# Patient Record
Sex: Female | Born: 1960 | Race: White | Hispanic: No | Smoking: Current some day smoker
Health system: Southern US, Community
[De-identification: ages and names within clinical notes are randomized; demographics above are authoritative.]

## PROBLEM LIST (undated history)

## (undated) DIAGNOSIS — R51 Headache: Secondary | ICD-10-CM

## (undated) DIAGNOSIS — M1990 Unspecified osteoarthritis, unspecified site: Secondary | ICD-10-CM

## (undated) DIAGNOSIS — K219 Gastro-esophageal reflux disease without esophagitis: Secondary | ICD-10-CM

## (undated) DIAGNOSIS — F410 Panic disorder [episodic paroxysmal anxiety] without agoraphobia: Secondary | ICD-10-CM

## (undated) DIAGNOSIS — R519 Headache, unspecified: Secondary | ICD-10-CM

## (undated) DIAGNOSIS — D649 Anemia, unspecified: Secondary | ICD-10-CM

## (undated) DIAGNOSIS — K589 Irritable bowel syndrome without diarrhea: Secondary | ICD-10-CM

## (undated) HISTORY — DX: Panic disorder (episodic paroxysmal anxiety): F41.0

## (undated) HISTORY — DX: Irritable bowel syndrome, unspecified: K58.9

## (undated) HISTORY — DX: Unspecified osteoarthritis, unspecified site: M19.90

## (undated) HISTORY — DX: Anemia, unspecified: D64.9

## (undated) HISTORY — PX: ABDOMINAL HYSTERECTOMY: SHX81

## (undated) HISTORY — DX: Gastro-esophageal reflux disease without esophagitis: K21.9

---

## 2011-07-08 ENCOUNTER — Other Ambulatory Visit: Payer: Self-pay

## 2011-07-08 ENCOUNTER — Emergency Department (HOSPITAL_COMMUNITY)
Admission: EM | Admit: 2011-07-08 | Discharge: 2011-07-08 | Disposition: A | Discharge status: Home / Self Care | Payer: BC Managed Care – PPO | Attending: Emergency Medicine | Admitting: Emergency Medicine

## 2011-07-08 ENCOUNTER — Encounter: Payer: Self-pay | Admitting: *Deleted

## 2011-07-08 DIAGNOSIS — T63441A Toxic effect of venom of bees, accidental (unintentional), initial encounter: Secondary | ICD-10-CM

## 2011-07-08 DIAGNOSIS — T63461A Toxic effect of venom of wasps, accidental (unintentional), initial encounter: Secondary | ICD-10-CM | POA: Insufficient documentation

## 2011-07-08 DIAGNOSIS — F17200 Nicotine dependence, unspecified, uncomplicated: Secondary | ICD-10-CM | POA: Insufficient documentation

## 2011-07-08 DIAGNOSIS — T6391XA Toxic effect of contact with unspecified venomous animal, accidental (unintentional), initial encounter: Secondary | ICD-10-CM | POA: Insufficient documentation

## 2011-07-08 DIAGNOSIS — F411 Generalized anxiety disorder: Secondary | ICD-10-CM | POA: Insufficient documentation

## 2011-07-08 HISTORY — DX: Headache, unspecified: R51.9

## 2011-07-08 HISTORY — DX: Headache: R51

## 2011-07-08 MED ORDER — SODIUM CHLORIDE 0.9 % IV BOLUS (SEPSIS)
1000.0000 mL | Freq: Once | INTRAVENOUS | Status: AC
  Administered 2011-07-08: 1000 mL via INTRAVENOUS

## 2011-07-08 MED ORDER — KETOROLAC TROMETHAMINE 30 MG/ML IJ SOLN
30.0000 mg | Freq: Once | INTRAMUSCULAR | Status: AC
  Administered 2011-07-08: 30 mg via INTRAVENOUS
  Filled 2011-07-08: qty 1

## 2011-07-08 NOTE — ED Provider Notes (Signed)
History   Scribed for Dayton Bailiff, MD, the patient was seen in room APA12/APA12. This chart was scribed by Clarita Crane. This patient's care was started at 7:53PM.  CSN: [id number] Arrival date & time: 07/08/2011  7:12 PM  Chief Complaint  Patient presents with  . Insect Bite   HPI Shaneice Barsanti is a 50 y.o. female who presents to the Emergency Department complaining of moderate painful bee sting proximal to left eye sustained this evening with associated mild left periorbital swelling. Patient notes she immediately took 2x Bendaryl-25mg  with onset of severe generalized HA, near syncope, hotflash and palpitations following. Notes HA has significantly improved since arrival in ED. Denies blurred vision, rash, chest pain, abdominal pain, SOB.  Patient reports she was stung by a bee last week and took Benadryl as well following the sting and did not have an adverse reaction. Also notes she had an episode of similar HA and hot-flash last week while she was mowing the lawn.   HPI ELEMENTS: Location: left periorbital region  Onset: this evening   Severity: moderate  Context:  as above  Associated symptoms: +left periorbital swelling, hot-flash, palpitations, near syncope, HA. Denies blurred vision, rash, chest pain, abdominal pain, SOB.   PAST MEDICAL HISTORY:  Past Medical History  Diagnosis Date  . Head ache     PAST SURGICAL HISTORY:  Past Surgical History  Procedure Date  . Abdominal hysterectomy     MEDICATIONS:  Previous Medications   DIPHENHYDRAMINE (BENADRYL) 25 MG CAPSULE    Take 50 mg by mouth daily as needed. For allergies      ALLERGIES:  Allergies as of 07/08/2011 - Review Complete 07/08/2011  Allergen Reaction Noted  . Bee venom Itching and Swelling 07/08/2011     FAMILY HISTORY:  History reviewed. No pertinent family history.   SOCIAL HISTORY: History   Social History  . Marital Status: Married    Spouse Name: N/A    Number of Children: N/A  . Years of  Education: N/A   Social History Main Topics  . Smoking status: Current Some Day Smoker -- 0.5 packs/day  . Smokeless tobacco: None  . Alcohol Use: No  . Drug Use: No  . Sexually Active: Yes    Birth Control/ Protection: Other-see comments   Other Topics Concern  . None   Social History Narrative  . None    Review of Systems 10 Systems reviewed and are negative for acute change except as noted in the HPI.  Physical Exam  BP 139/86  Pulse 78  Temp(Src) 97.8 F (36.6 C) (Oral)  Resp 18  Ht 5' (1.524 m)  Wt 105 lb (47.628 kg)  BMI 20.51 kg/m2  SpO2 100%  Physical Exam  Nursing note and vitals reviewed. Constitutional: She is oriented to person, place, and time. She appears well-developed and well-nourished. No distress.  HENT:  Head: Normocephalic.  Mouth/Throat: Oropharynx is clear and moist.  Eyes: EOM are normal. Pupils are equal, round, and reactive to light.       Mild left periorbital swelling and redness.   Neck: Neck supple.  Cardiovascular: Normal rate and regular rhythm.  Exam reveals no gallop and no friction rub.   No murmur heard. Pulmonary/Chest: Effort normal and breath sounds normal. She has no wheezes.  Abdominal: Soft. Bowel sounds are normal. She exhibits no distension.  Musculoskeletal: Normal range of motion. She exhibits no edema.  Neurological: She is alert and oriented to person, place, and time. No sensory deficit.  Skin: Skin is warm and dry.  Psychiatric: Her behavior is normal. Her mood appears anxious.    ED Course  Procedures  OTHER DATA REVIEWED: Nursing notes, vital signs, and past medical records reviewed. Lab results reviewed and considered Imaging results reviewed and considered  DIAGNOSTIC STUDIES: Oxygen Saturation is 100% on room air, normal by my interpretation.     Date: 07/08/2011  Rate: 66 BPM  Rhythm: normal sinus rhythm  QRS Axis: normal  Intervals: normal  ST/T Wave abnormalities: normal  Conduction  Disutrbances:none  Narrative Interpretation:   Old EKG Reviewed: none available  ED COURSE / COORDINATION OF CARE: Orders Placed This Encounter  Procedures  . EKG test  9:08PM- Patient informed of results of EKG and intent to d/c home. Home care plan discussed and reasons to return to ED explained. Patient agrees with plan set forth at this time.   MDM: Differential Diagnosis: insect sting with mild reaction.  Anxiety. On arrival to emergency department the patient's symptoms had resolved. She had no rash, shortness of breath, headache. There was question as to whether or not she is syncopal episode. The patient did not actually have a syncopal episode this is more of an anxiety attack she states she's had a history of these with prior insect stings. She received IV fluids, had a negative EKG, received Toradol with resolution of her symptoms. The patient states she's going better and ready for discharge. I advised her to continue by mouth hydration at home. Benadryl as needed. There is no need for prednisone at this time. She did not have an anaphylactic response therefore she does not warrant an EpiPen. She is provided instructions for which returned emergency department. Was instructed to followup with her primary physician as needed   PLAN: Discharge Home The patient is to return the emergency department if there is any worsening of symptoms. I have reviewed the discharge instructions with the patient/family  CONDITION ON DISCHARGE: Good   MEDICATIONS GIVEN IN THE E.D.  Medications  sodium chloride 0.9 % bolus 1,000 mL (1000 mL Intravenous Given 07/08/11 2019)  diphenhydrAMINE (BENADRYL) 25 mg capsule (not administered)  ketorolac (TORADOL) injection 30 mg (30 mg Intravenous Given 07/08/11 2019)      I personally performed the services described in this documentation, which was scribed in my presence. The recorded information has been reviewed and considered.         Dayton Bailiff, MD 07/08/11 2140

## 2011-07-08 NOTE — ED Notes (Signed)
Pt stung over left eye & now has a headache. No respiratory distress noted, no difficulty speaking or swallowing.

## 2011-07-08 NOTE — ED Notes (Signed)
Pt took 2 25mg  bynedral PTA.

## 2011-07-08 NOTE — ED Notes (Signed)
Pt stung over left eye. Pt reports miagrain at this time.

## 2015-04-28 ENCOUNTER — Emergency Department (HOSPITAL_COMMUNITY)
Admission: EM | Admit: 2015-04-28 | Discharge: 2015-04-29 | Disposition: A | Discharge status: Home / Self Care | Payer: PRIVATE HEALTH INSURANCE | Attending: Emergency Medicine | Admitting: Emergency Medicine

## 2015-04-28 ENCOUNTER — Emergency Department (HOSPITAL_COMMUNITY): Payer: PRIVATE HEALTH INSURANCE

## 2015-04-28 ENCOUNTER — Encounter (HOSPITAL_COMMUNITY): Payer: Self-pay | Admitting: *Deleted

## 2015-04-28 DIAGNOSIS — S0990XA Unspecified injury of head, initial encounter: Secondary | ICD-10-CM | POA: Diagnosis present

## 2015-04-28 DIAGNOSIS — W01198A Fall on same level from slipping, tripping and stumbling with subsequent striking against other object, initial encounter: Secondary | ICD-10-CM | POA: Insufficient documentation

## 2015-04-28 DIAGNOSIS — S060X0A Concussion without loss of consciousness, initial encounter: Secondary | ICD-10-CM | POA: Diagnosis not present

## 2015-04-28 DIAGNOSIS — Y9389 Activity, other specified: Secondary | ICD-10-CM | POA: Diagnosis not present

## 2015-04-28 DIAGNOSIS — Y92096 Garden or yard of other non-institutional residence as the place of occurrence of the external cause: Secondary | ICD-10-CM | POA: Diagnosis not present

## 2015-04-28 DIAGNOSIS — S0181XA Laceration without foreign body of other part of head, initial encounter: Secondary | ICD-10-CM | POA: Insufficient documentation

## 2015-04-28 DIAGNOSIS — Z23 Encounter for immunization: Secondary | ICD-10-CM | POA: Diagnosis not present

## 2015-04-28 DIAGNOSIS — S0993XA Unspecified injury of face, initial encounter: Secondary | ICD-10-CM | POA: Diagnosis not present

## 2015-04-28 DIAGNOSIS — Z720 Tobacco use: Secondary | ICD-10-CM | POA: Diagnosis not present

## 2015-04-28 DIAGNOSIS — Y998 Other external cause status: Secondary | ICD-10-CM | POA: Insufficient documentation

## 2015-04-28 MED ORDER — LIDOCAINE-EPINEPHRINE-TETRACAINE (LET) SOLUTION
NASAL | Status: AC
  Administered 2015-04-29: 3 mL
  Filled 2015-04-28: qty 6

## 2015-04-28 MED ORDER — LIDOCAINE HCL (PF) 1 % IJ SOLN: 5.0000 mL | Freq: Once | INTRAMUSCULAR | Status: DC

## 2015-04-28 MED ORDER — LIDOCAINE HCL (PF) 1 % IJ SOLN
INTRAMUSCULAR | Status: AC
  Filled 2015-04-28: qty 10

## 2015-04-28 MED ORDER — TETANUS-DIPHTH-ACELL PERTUSSIS 5-2.5-18.5 LF-MCG/0.5 IM SUSP
0.5000 mL | Freq: Once | INTRAMUSCULAR | Status: AC
  Administered 2015-04-28: 0.5 mL via INTRAMUSCULAR
  Filled 2015-04-28: qty 0.5

## 2015-04-28 MED ORDER — LIDOCAINE-EPINEPHRINE-TETRACAINE (LET) SOLUTION
3.0000 mL | Freq: Once | NASAL | Status: AC
  Administered 2015-04-28: 3 mL via TOPICAL

## 2015-04-28 NOTE — ED Provider Notes (Signed)
CSN: [id number]     Arrival date & time 04/28/15  2037 History  This chart was scribed for Christy Rhineonald Karalee Hauter, MD by Merlene LaughterAdem Cosgel, ED Scribe. This patient was seen in room APA10/APA10 and the patient's care was started at 9:54 PM.  Chief Complaint  Patient presents with  . Facial Laceration   Patient is a 54 y.o. female presenting with scalp laceration. The history is provided by the patient. No language interpreter was used.  Head Laceration This is a new problem. The problem has not changed since onset.Associated symptoms include headaches. Pertinent negatives include no chest pain. Nothing aggravates the symptoms. Nothing relieves the symptoms. She has tried nothing for the symptoms.    HPI Comments: Christy Irwin is a 54 y.o. female who presents to the Emergency Department complaining of 4-5 cm, facial laceration to her forehead onset earlier today.  She states she was in the yard, where she fell and hit her head on a metal yard decoration. Patient complains of associated controlled bleeding, headache, loose incisor. Patient denies LOC, dizziness, chest pain and weakness. Her last tetanus shot was 25 years ago.  She also feels her tooth may be loose Past Medical History  Diagnosis Date  . Head ache    Past Surgical History  Procedure Laterality Date  . Abdominal hysterectomy     History reviewed. No pertinent family history. History  Substance Use Topics  . Smoking status: Current Some Day Smoker -- 0.50 packs/day  . Smokeless tobacco: Not on file  . Alcohol Use: Yes   OB History    No data available     Review of Systems  HENT: Positive for dental problem.   Cardiovascular: Negative for chest pain.  Skin: Positive for wound.  Neurological: Positive for headaches. Negative for dizziness, syncope and weakness.  All other systems reviewed and are negative.     Allergies  Bee venom  Home Medications   Prior to Admission medications   Medication Sig Start Date End Date  Taking? Authorizing Provider  diphenhydrAMINE (BENADRYL) 25 mg capsule Take 50 mg by mouth daily as needed. For allergies     Historical Provider, MD   Triage Vitals: BP 127/79 mmHg  Pulse 105  Temp(Src) 98.5 F (36.9 C) (Oral)  Resp 18  Ht 5' (1.524 m)  Wt 130 lb (58.968 kg)  BMI 25.39 kg/m2  SpO2 98%   Physical Exam  CONSTITUTIONAL: Well developed/well nourished, smells of alcohol HEAD: Abrasion to posterior scalp, large v-shaped laceration to forehead EYES: EOMI/PERRL ENMT: Mucous membranes moist,right upper incisor loose to palpation but is stable and not fractured, no other dental/nasal trauma NECK: supple no meningeal signs SPINE/BACK:entire spine nontender CV: S1/S2 noted, no murmurs/rubs/gallops noted LUNGS: Lungs are clear to auscultation bilaterally, no apparent distress ABDOMEN: soft, nontender, no rebound or guarding, bowel sounds noted throughout abdomen GU:no cva tenderness NEURO: Pt is awake/alert/appropriate, moves all extremitiesx4.  No facial droop.  Pt is ambulatory EXTREMITIES: pulses normal/equal, full ROM, no evidence of extremity trauma SKIN: warm, color normal PSYCH: no abnormalities of mood noted, alert and oriented to situation   ED Course  Procedures DIAGNOSTIC STUDIES: Oxygen Saturation is 98% on room air, normal by my interpretation.    COORDINATION OF CARE: 9:59 PM- Discussed plan for diagnostic CT of head. Pt advised of plan for treatment, which includes laceration repair, tetanus shot and medication for pain and pt agrees. Pt smells of ETOH with large laceration to forehead as well as abrasion to scalp - CT  head indicated CT head negative Also has mild dental subluxation but no fx noted.  Advised soft diet and f/u with dentistry Forehead laceration to be repaired by Nurse practitioner. She told nurse she "passed out" twice but denies this on my evaluation  Imaging Review Ct Head Wo Contrast  04/28/2015   CLINICAL DATA:  Christy Irwin, striking a  metal yard duration at home today. Syncopal episodes twice since then.  EXAM: CT HEAD WITHOUT CONTRAST  TECHNIQUE: Contiguous axial images were obtained from the base of the skull through the vertex without intravenous contrast.  COMPARISON:  None.  FINDINGS: There is mild soft tissue thickening in the high right parietal scalp. The calvarium and skullbase are intact.  There is no intracranial hemorrhage, mass or evidence of acute infarction. There is no extra-axial fluid collection. Gray matter and white matter appear normal. Cerebral volume is normal for age. Brainstem and posterior fossa are unremarkable. The CSF spaces appear normal.  IMPRESSION: Negative for acute intracranial traumatic injury.  Normal brain.   Electronically Signed   By: Ellery Plunk M.D.   On: 04/28/2015 22:17     MDM   Final diagnoses:  Concussion, without loss of consciousness, initial encounter  Laceration of forehead, initial encounter  Dental injury, initial encounter    Nursing notes including past medical history and social history reviewed and considered in documentation   I personally performed the services described in this documentation, which was scribed in my presence. The recorded information has been reviewed and is accurate.       Christy Rhine, MD 04/28/15 [phone]

## 2015-04-28 NOTE — ED Provider Notes (Signed)
**  THIS IS A SHARED VISIT WITH DR. Bebe ShaggyWICKLINE** LACERATION REPAIR  Harlen LabsConnie Irwin is a 54 y.o. female with a 5 cm laceration the forehead after she fell in her garden tonight. LACERATION REPAIR Performed by: Demarkis Gheen Authorized by: Johnna Bollier Consent: Verbal consent obtained. Risks and benefits: risks, benefits and alternatives were discussed Consent given by: patient Patient identity confirmed: provided demographic data Prepped and Draped in normal sterile fashion Wound explored  Laceration Location: forehead V shaped  Laceration Length: 5 cm  No Foreign Bodies seen or palpated  Anesthesia: local infiltration  Local anesthetic: lidocaine 1% without epinephrine  Anesthetic total: 6 ml  Irrigation method: syringe Amount of cleaning: standard  Skin closure: 6-0 prolene  Number of sutures: 17  Technique: interrupted  Patient tolerance: Patient tolerated the procedure well with no immediate complications.   Instructions to patient regarding follow up.   [address]Christy Irwin [place] Christy [place], [place] 04/28/15 [phone]  Zadie Rhineonald Wickline, MD 04/29/15 [phone]

## 2015-04-28 NOTE — ED Notes (Signed)
Pt fell and hit a metal yard decoration in yard today, pt states that she passed out x 2 since

## 2015-04-28 NOTE — Discharge Instructions (Signed)
Concussion  A concussion, or closed-head injury, is a brain injury caused by a direct blow to the head or by a quick and sudden movement (jolt) of the head or neck. Concussions are usually not life-threatening. Even so, the effects of a concussion can be serious. If you have had a concussion before, you are more likely to experience concussion-like symptoms after a direct blow to the head.   CAUSES  · Direct blow to the head, such as from running into another player during a soccer game, being hit in a fight, or hitting your head on a hard surface.  · A jolt of the head or neck that causes the brain to move back and forth inside the skull, such as in a car crash.  SIGNS AND SYMPTOMS  The signs of a concussion can be hard to notice. Early on, they may be missed by you, family members, and health care providers. You may look fine but act or feel differently.  Symptoms are usually temporary, but they may last for days, weeks, or even longer. Some symptoms may appear right away while others may not show up for hours or days. Every head injury is different. Symptoms include:  · Mild to moderate headaches that will not go away.  · A feeling of pressure inside your head.  · Having more trouble than usual:  ¨ Learning or remembering things you have heard.  ¨ Answering questions.  ¨ Paying attention or concentrating.  ¨ Organizing daily tasks.  ¨ Making decisions and solving problems.  · Slowness in thinking, acting or reacting, speaking, or reading.  · Getting lost or being easily confused.  · Feeling tired all the time or lacking energy (fatigued).  · Feeling drowsy.  · Sleep disturbances.  ¨ Sleeping more than usual.  ¨ Sleeping less than usual.  ¨ Trouble falling asleep.  ¨ Trouble sleeping (insomnia).  · Loss of balance or feeling lightheaded or dizzy.  · Nausea or vomiting.  · Numbness or tingling.  · Increased sensitivity to:  ¨ Sounds.  ¨ Lights.  ¨ Distractions.  · Vision problems or eyes that tire  easily.  · Diminished sense of taste or smell.  · Ringing in the ears.  · Mood changes such as feeling sad or anxious.  · Becoming easily irritated or angry for little or no reason.  · Lack of motivation.  · Seeing or hearing things other people do not see or hear (hallucinations).  DIAGNOSIS  Your health care provider can usually diagnose a concussion based on a description of your injury and symptoms. He or she will ask whether you passed out (lost consciousness) and whether you are having trouble remembering events that happened right before and during your injury.  Your evaluation might include:  · A brain scan to look for signs of injury to the brain. Even if the test shows no injury, you may still have a concussion.  · Blood tests to be sure other problems are not present.  TREATMENT  · Concussions are usually treated in an emergency department, in urgent care, or at a clinic. You may need to stay in the hospital overnight for further treatment.  · Tell your health care provider if you are taking any medicines, including prescription medicines, over-the-counter medicines, and natural remedies. Some medicines, such as blood thinners (anticoagulants) and aspirin, may increase the chance of complications. Also tell your health care provider whether you have had alcohol or are taking illegal drugs. This information   may affect treatment.  · Your health care provider will send you home with important instructions to follow.  · How fast you will recover from a concussion depends on many factors. These factors include how severe your concussion is, what part of your brain was injured, your age, and how healthy you were before the concussion.  · Most people with mild injuries recover fully. Recovery can take time. In general, recovery is slower in older persons. Also, persons who have had a concussion in the past or have other medical problems may find that it takes longer to recover from their current injury.  HOME  CARE INSTRUCTIONS  General Instructions  · Carefully follow the directions your health care provider gave you.  · Only take over-the-counter or prescription medicines for pain, discomfort, or fever as directed by your health care provider.  · Take only those medicines that your health care provider has approved.  · Do not drink alcohol until your health care provider says you are well enough to do so. Alcohol and certain other drugs may slow your recovery and can put you at risk of further injury.  · If it is harder than usual to remember things, write them down.  · If you are easily distracted, try to do one thing at a time. For example, do not try to watch TV while fixing dinner.  · Talk with family members or close friends when making important decisions.  · Keep all follow-up appointments. Repeated evaluation of your symptoms is recommended for your recovery.  · Watch your symptoms and tell others to do the same. Complications sometimes occur after a concussion. Older adults with a brain injury may have a higher risk of serious complications, such as a blood clot on the brain.  · Tell your teachers, school nurse, school counselor, coach, athletic trainer, or work manager about your injury, symptoms, and restrictions. Tell them about what you can or cannot do. They should watch for:  ¨ Increased problems with attention or concentration.  ¨ Increased difficulty remembering or learning new information.  ¨ Increased time needed to complete tasks or assignments.  ¨ Increased irritability or decreased ability to cope with stress.  ¨ Increased symptoms.  · Rest. Rest helps the brain to heal. Make sure you:  ¨ Get plenty of sleep at night. Avoid staying up late at night.  ¨ Keep the same bedtime hours on weekends and weekdays.  ¨ Rest during the day. Take daytime naps or rest breaks when you feel tired.  · Limit activities that require a lot of thought or concentration. These include:  ¨ Doing homework or job-related  work.  ¨ Watching TV.  ¨ Working on the computer.  · Avoid any situation where there is potential for another head injury (football, hockey, soccer, basketball, martial arts, downhill snow sports and horseback riding). Your condition will get worse every time you experience a concussion. You should avoid these activities until you are evaluated by the appropriate follow-up health care providers.  Returning To Your Regular Activities  You will need to return to your normal activities slowly, not all at once. You must give your body and brain enough time for recovery.  · Do not return to sports or other athletic activities until your health care provider tells you it is safe to do so.  · Ask your health care provider when you can drive, ride a bicycle, or operate heavy machinery. Your ability to react may be slower after a   brain injury. Never do these activities if you are dizzy.  · Ask your health care provider about when you can return to work or school.  Preventing Another Concussion  It is very important to avoid another brain injury, especially before you have recovered. In rare cases, another injury can lead to permanent brain damage, brain swelling, or death. The risk of this is greatest during the first 7-10 days after a head injury. Avoid injuries by:  · Wearing a seat belt when riding in a car.  · Drinking alcohol only in moderation.  · Wearing a helmet when biking, skiing, skateboarding, skating, or doing similar activities.  · Avoiding activities that could lead to a second concussion, such as contact or recreational sports, until your health care provider says it is okay.  · Taking safety measures in your home.  ¨ Remove clutter and tripping hazards from floors and stairways.  ¨ Use grab bars in bathrooms and handrails by stairs.  ¨ Place non-slip mats on floors and in bathtubs.  ¨ Improve lighting in dim areas.  SEEK MEDICAL CARE IF:  · You have increased problems paying attention or  concentrating.  · You have increased difficulty remembering or learning new information.  · You need more time to complete tasks or assignments than before.  · You have increased irritability or decreased ability to cope with stress.  · You have more symptoms than before.  Seek medical care if you have any of the following symptoms for more than 2 weeks after your injury:  · Lasting (chronic) headaches.  · Dizziness or balance problems.  · Nausea.  · Vision problems.  · Increased sensitivity to noise or light.  · Depression or mood swings.  · Anxiety or irritability.  · Memory problems.  · Difficulty concentrating or paying attention.  · Sleep problems.  · Feeling tired all the time.  SEEK IMMEDIATE MEDICAL CARE IF:  · You have severe or worsening headaches. These may be a sign of a blood clot in the brain.  · You have weakness (even if only in one hand, leg, or part of the face).  · You have numbness.  · You have decreased coordination.  · You vomit repeatedly.  · You have increased sleepiness.  · One pupil is larger than the other.  · You have convulsions.  · You have slurred speech.  · You have increased confusion. This may be a sign of a blood clot in the brain.  · You have increased restlessness, agitation, or irritability.  · You are unable to recognize people or places.  · You have neck pain.  · It is difficult to wake you up.  · You have unusual behavior changes.  · You lose consciousness.  MAKE SURE YOU:  · Understand these instructions.  · Will watch your condition.  · Will get help right away if you are not doing well or get worse.  Document Released: 01/07/2004 Document Revised: 10/22/2013 Document Reviewed: 05/09/2013  [employer]® Patient Information ©2015 [employer]. This information is not intended to replace advice given to you by your health care provider. Make sure you discuss any questions you have with your health care provider.

## 2015-05-05 ENCOUNTER — Emergency Department (HOSPITAL_COMMUNITY)
Admission: EM | Admit: 2015-05-05 | Discharge: 2015-05-05 | Disposition: A | Discharge status: Home / Self Care | Payer: PRIVATE HEALTH INSURANCE | Attending: Emergency Medicine | Admitting: Emergency Medicine

## 2015-05-05 ENCOUNTER — Encounter (HOSPITAL_COMMUNITY): Payer: Self-pay | Admitting: Emergency Medicine

## 2015-05-05 DIAGNOSIS — Z720 Tobacco use: Secondary | ICD-10-CM | POA: Diagnosis not present

## 2015-05-05 DIAGNOSIS — Z4802 Encounter for removal of sutures: Secondary | ICD-10-CM | POA: Insufficient documentation

## 2015-05-05 NOTE — ED Notes (Signed)
PT had stitches on 04/28/15 with no c/o pain or drainage noted.

## 2015-05-05 NOTE — Discharge Instructions (Signed)
Scar Minimization °You will have a scar anytime you have surgery and a cut is made in the skin or you have something removed from your skin (mole, skin cancer, cyst). Although scars are unavoidable following surgery, there are ways to minimize their appearance. °It is important to follow all the instructions you receive from your caregiver about wound care. How your wound heals will influence the appearance of your scar. If you do not follow the wound care instructions as directed, complications such as infection may occur. Wound instructions include keeping the wound clean, moist, and not letting the wound form a scab. Some people form scars that are raised and lumpy (hypertrophic) or larger than the initial wound (keloidal). °HOME CARE INSTRUCTIONS  °· Follow wound care instructions as directed. °· Keep the wound clean by washing it with soap and water. °· Keep the wound moist with provided antibiotic cream or petroleum jelly until completely healed. Moisten twice a day for about 2 weeks. °· Get stitches (sutures) taken out at the scheduled time. °· Avoid touching or manipulating your wound unless needed. Wash your hands thoroughly before and after touching your wound. °· Follow all restrictions such as limits on exercise or work. This depends on where your scar is located. °· Keep the scar protected from sunburn. Cover the scar with sunscreen/sunblock with SPF 30 or higher. °· Gently massage the scar using a circular motion to help minimize the appearance of the scar. Do this only after the wound has closed and all the sutures have been removed. °· For hypertrophic or keloidal scars, there are several ways to treat and minimize their appearance. Methods include compression therapy, intralesional corticosteroids, laser therapy, or surgery. These methods are performed by your caregiver. °Remember that the scar may appear lighter or darker than your normal skin color. This difference in color should even out with  time. °SEEK MEDICAL CARE IF:  °· You have a fever. °· You develop signs of infection such as pain, redness, pus, and warmth. °· You have questions or concerns. °Document Released: 04/06/2010 Document Revised: 01/09/2012 Document Reviewed: 04/06/2010 °ExitCare® Patient Information ©2015 [employer]. This information is not intended to replace advice given to you by your health care provider. Make sure you discuss any questions you have with your health care provider. ° °Suture Removal, Care After °Refer to this sheet in the next few weeks. These instructions provide you with information on caring for yourself after your procedure. Your health care provider may also give you more specific instructions. Your treatment has been planned according to current medical practices, but problems sometimes occur. Call your health care provider if you have any problems or questions after your procedure. °WHAT TO EXPECT AFTER THE PROCEDURE °After your stitches (sutures) are removed, it is typical to have the following: °· Some discomfort and swelling in the wound area. °· Slight redness in the area. °HOME CARE INSTRUCTIONS  °· If you have skin adhesive strips over the wound area, do not take the strips off. They will fall off on their own in a few days. If the strips remain in place after 14 days, you may remove them. °· Change any bandages (dressings) at least once a day or as directed by your health care provider. If the bandage sticks, soak it off with warm, soapy water. °· Apply cream or ointment only as directed by your health care provider. If using cream or ointment, wash the area with soap and water 2 times a day to remove   all the cream or ointment. Rinse off the soap and pat the area dry with a clean towel. °· Keep the wound area dry and clean. If the bandage becomes wet or dirty, or if it develops a bad smell, change it as soon as possible. °· Continue to protect the wound from injury. °· Use sunscreen when out in the  sun. New scars become sunburned easily. °SEEK MEDICAL CARE IF: °· You have increasing redness, swelling, or pain in the wound. °· You see pus coming from the wound. °· You have a fever. °· You notice a bad smell coming from the wound or dressing. °· Your wound breaks open (edges not staying together). °Document Released: 07/12/2001 Document Revised: 08/07/2013 Document Reviewed: 05/29/2013 °ExitCare® Patient Information ©2015 [employer]. This information is not intended to replace advice given to you by your health care provider. Make sure you discuss any questions you have with your health care provider. ° °

## 2015-05-05 NOTE — ED Provider Notes (Signed)
CSN: [id number]     Arrival date & time 05/05/15  1342 History   First MD Initiated Contact with Patient 05/05/15 1412     Chief Complaint  Patient presents with  . Suture / Staple Removal     (Consider location/radiation/quality/duration/timing/severity/associated sxs/prior Treatment) HPI  Christy Irwin is a 54 y.o. female who presents to the Emergency Department requesting suture removal.  She was seen here on 04/28/15 after a fall.  She denies any pain or worsening symptoms.  States the wound has been healing well.  Denies swelling, drainage, redness or dizziness, and visual changes  Past Medical History  Diagnosis Date  . Head ache    Past Surgical History  Procedure Laterality Date  . Abdominal hysterectomy     History reviewed. No pertinent family history. History  Substance Use Topics  . Smoking status: Current Some Day Smoker -- 0.50 packs/day  . Smokeless tobacco: Not on file  . Alcohol Use: Yes   OB History    Gravida Para Term Preterm AB TAB SAB Ectopic Multiple Living            2     Review of Systems  Constitutional: Negative for fever and chills.  Musculoskeletal: Negative for back pain, joint swelling and arthralgias.  Skin: Positive for wound.       Laceration forehead with intact sutures  Neurological: Negative for dizziness, weakness, numbness and headaches.  Hematological: Does not bruise/bleed easily.  All other systems reviewed and are negative.     Allergies  Bee venom  Home Medications   Prior to Admission medications   Medication Sig Start Date End Date Taking? Authorizing Provider  diphenhydrAMINE (BENADRYL) 25 mg capsule Take 50 mg by mouth daily as needed. For allergies     Historical Provider, MD   BP 116/72 mmHg  Pulse 84  Temp(Src) 99 F (37.2 C) (Oral)  Resp 18  Ht 5' (1.524 m)  Wt 109 lb (49.442 kg)  BMI 21.29 kg/m2  SpO2 97% Physical Exam  Constitutional: She is oriented to person, place, and time. She appears  well-developed and well-nourished. No distress.  HENT:  Head: Normocephalic and atraumatic.  Cardiovascular: Normal rate, regular rhythm, normal heart sounds and intact distal pulses.   No murmur heard. Pulmonary/Chest: Effort normal and breath sounds normal. No respiratory distress.  Musculoskeletal: She exhibits no edema or tenderness.  Neurological: She is alert and oriented to person, place, and time. She exhibits normal muscle tone. Coordination normal.  Skin: Skin is warm. Laceration noted.  Laceration to the forehead.  Wound appears to be healing well.  Sutures intact. No edema or drainage. Wound dehiscence  Nursing note and vitals reviewed.   ED Course  Procedures (including critical care time) Labs Review Labs Reviewed - No data to display  Imaging Review No results found.   EKG Interpretation None      MDM   Final diagnoses:  Visit for suture removal    Laceration to forehead with sutures placed on 04/28/2015.  appears to be healing well. Suture line intact patient denies complaints at this time.  Sutures removed by nursing staff without difficulty. Wound care instructions given. Patient ambulatory with a steady gait. Agrees to follow-up with PCP if needed.  Pauline Ausammy Kashton Mcartor, PA-C 05/06/15 1707  Gilda Creasehristopher J Pollina, MD 05/08/15 [phone]

## 2016-04-20 ENCOUNTER — Other Ambulatory Visit (HOSPITAL_COMMUNITY): Payer: Self-pay | Admitting: Registered Nurse

## 2016-04-20 ENCOUNTER — Ambulatory Visit (HOSPITAL_COMMUNITY)
Admission: RE | Admit: 2016-04-20 | Discharge: 2016-04-20 | Disposition: A | Discharge status: Home / Self Care | Payer: PRIVATE HEALTH INSURANCE | Source: Ambulatory Visit | Attending: Registered Nurse | Admitting: Registered Nurse

## 2016-04-20 DIAGNOSIS — R0781 Pleurodynia: Secondary | ICD-10-CM | POA: Diagnosis not present

## 2016-04-20 DIAGNOSIS — M4806 Spinal stenosis, lumbar region: Secondary | ICD-10-CM | POA: Insufficient documentation

## 2016-04-20 DIAGNOSIS — M5136 Other intervertebral disc degeneration, lumbar region: Secondary | ICD-10-CM | POA: Insufficient documentation

## 2016-04-20 DIAGNOSIS — M545 Low back pain: Secondary | ICD-10-CM | POA: Diagnosis not present

## 2016-04-25 ENCOUNTER — Other Ambulatory Visit (HOSPITAL_COMMUNITY): Payer: Self-pay | Admitting: Registered Nurse

## 2016-04-25 ENCOUNTER — Ambulatory Visit (HOSPITAL_COMMUNITY)
Admission: RE | Admit: 2016-04-25 | Discharge: 2016-04-25 | Disposition: A | Discharge status: Home / Self Care | Payer: PRIVATE HEALTH INSURANCE | Source: Ambulatory Visit | Attending: Registered Nurse | Admitting: Registered Nurse

## 2016-04-25 DIAGNOSIS — M25551 Pain in right hip: Secondary | ICD-10-CM

## 2016-04-26 ENCOUNTER — Other Ambulatory Visit (HOSPITAL_COMMUNITY): Payer: Self-pay | Admitting: Registered Nurse

## 2016-04-26 DIAGNOSIS — Z1231 Encounter for screening mammogram for malignant neoplasm of breast: Secondary | ICD-10-CM

## 2016-05-02 ENCOUNTER — Ambulatory Visit (HOSPITAL_COMMUNITY)
Admission: RE | Admit: 2016-05-02 | Discharge: 2016-05-02 | Disposition: A | Discharge status: Home / Self Care | Payer: PRIVATE HEALTH INSURANCE | Source: Ambulatory Visit | Attending: Registered Nurse | Admitting: Registered Nurse

## 2016-05-02 DIAGNOSIS — Z1231 Encounter for screening mammogram for malignant neoplasm of breast: Secondary | ICD-10-CM | POA: Insufficient documentation

## 2016-09-09 IMAGING — CT CT HEAD W/O CM
1 series · 16 of 30 positions shown, 20 images · non-contrast
Comparison: None.

CLINICAL DATA: Fell, striking a metal yard duration at home today.
Syncopal episodes twice since then.

EXAM:
CT HEAD WITHOUT CONTRAST
TECHNIQUE: Contiguous axial images were obtained from the base of the skull
through the vertex without intravenous contrast.

[Series 2: headtrauma 4.8 h37s · axial · 0.43mm/px · z∈[+76,+234]mm · 16 of 36 slices shown, 20 images]
[im 2/36  brain]
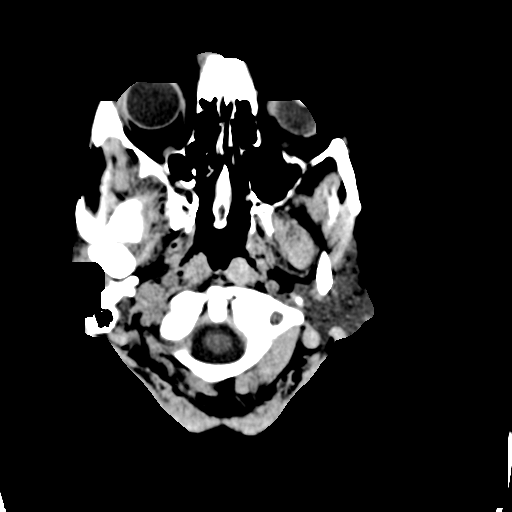
[im 2/36  bone]
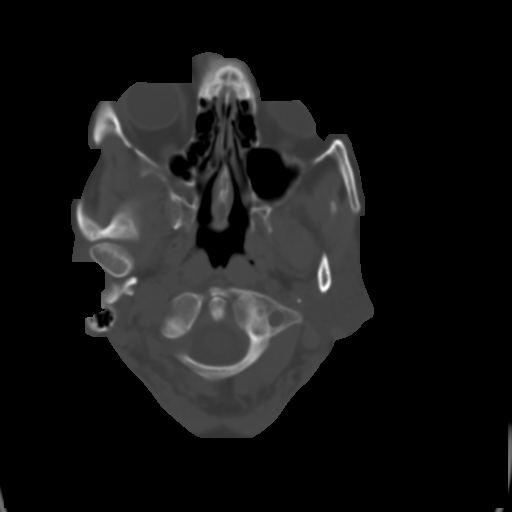
[im 4/36  brain]
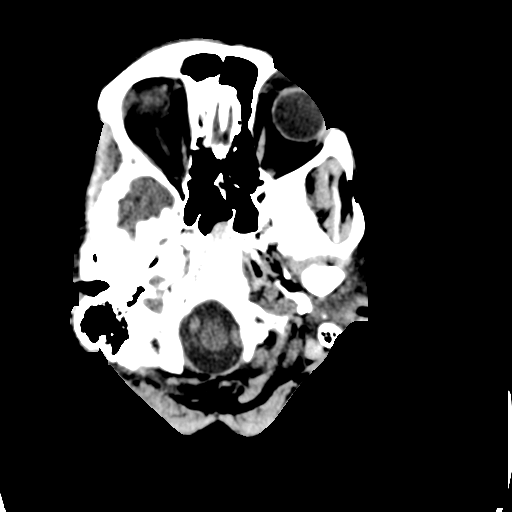
[im 7/36  brain]
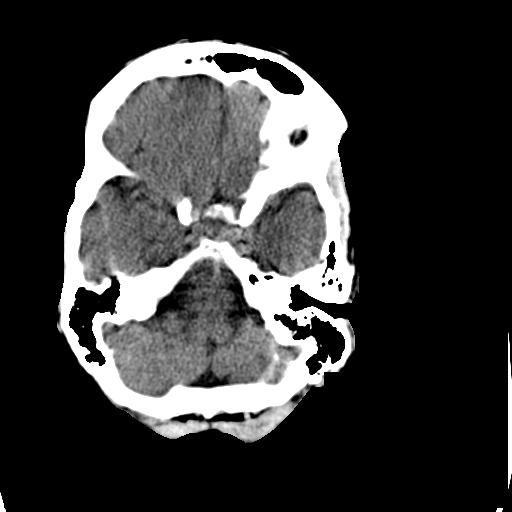
[im 9/36  brain]
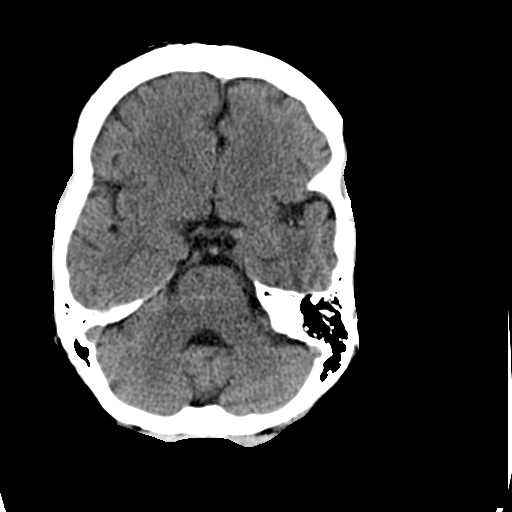
[im 10/36  brain]
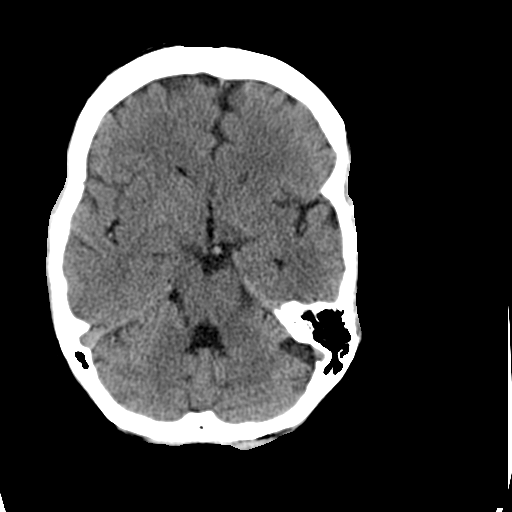
[im 10/36  bone]
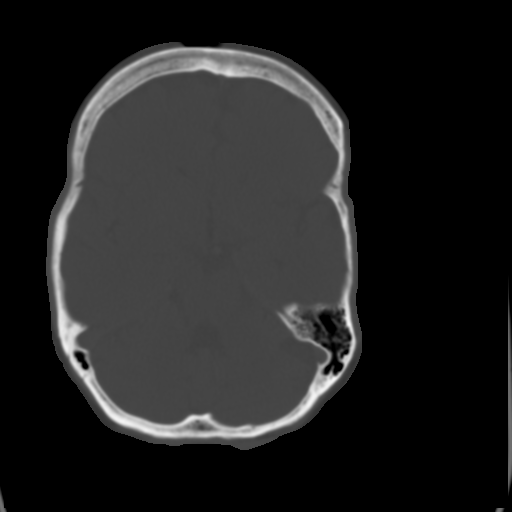
[im 13/36  brain]
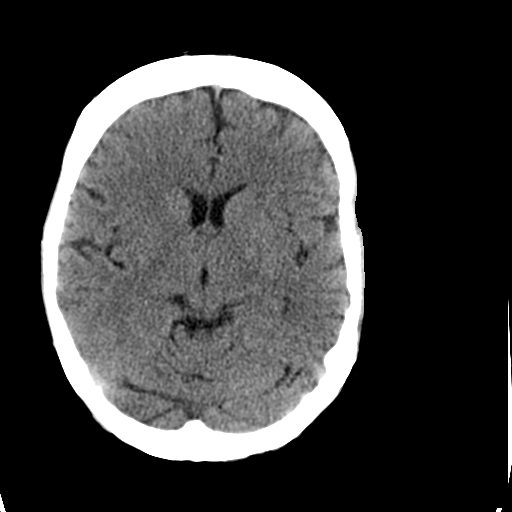
[im 15/36  brain]
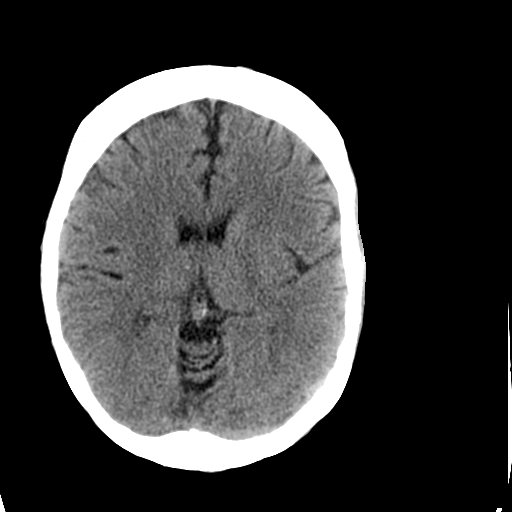
[im 17/36  brain]
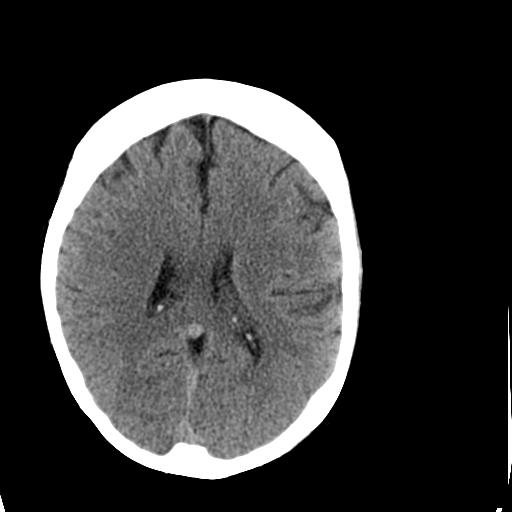
[im 19/36  brain]
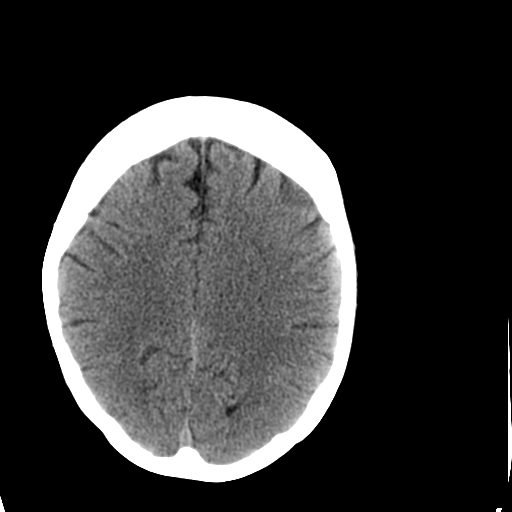
[im 19/36  bone]
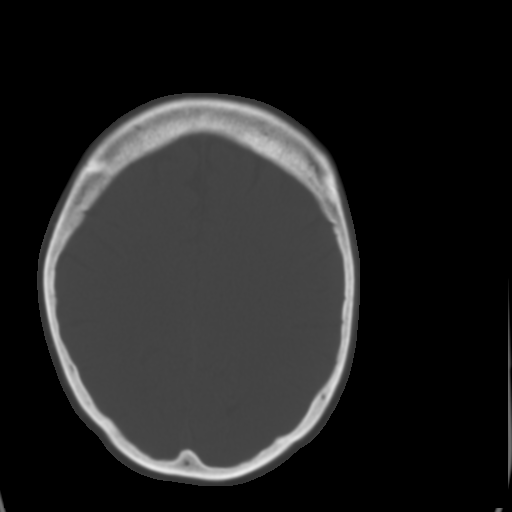
[im 21/36  brain]
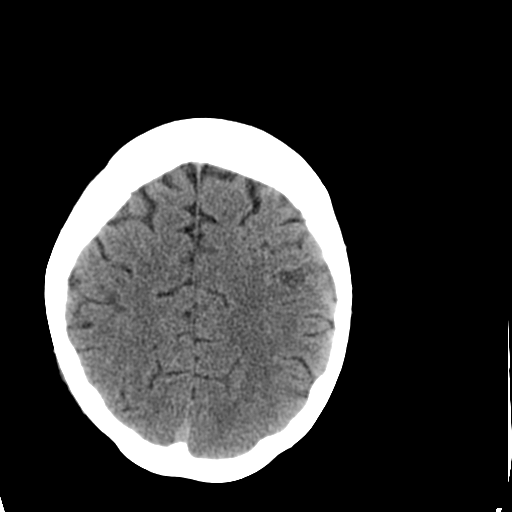
[im 23/36  brain]
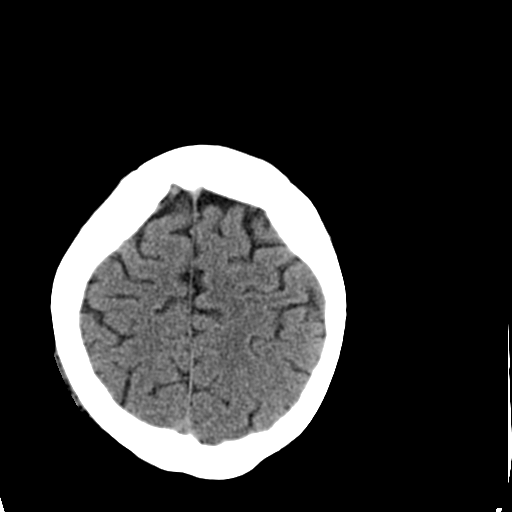
[im 26/36  brain]
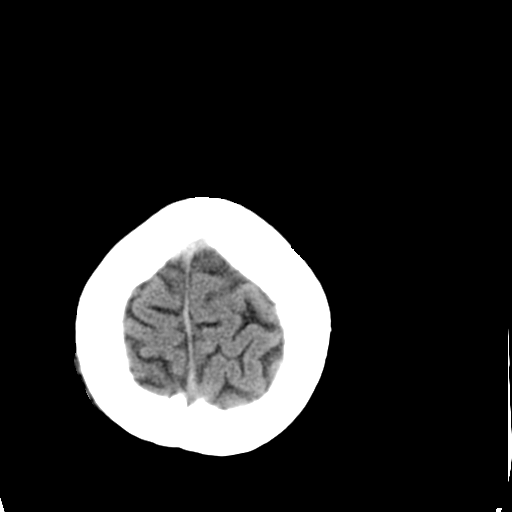
[im 27/36  brain]
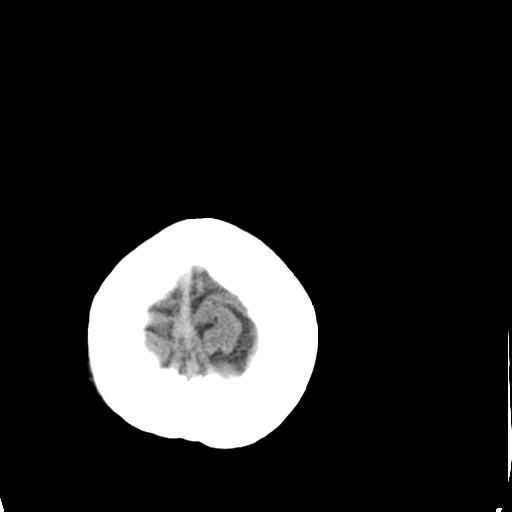
[im 27/36  bone]
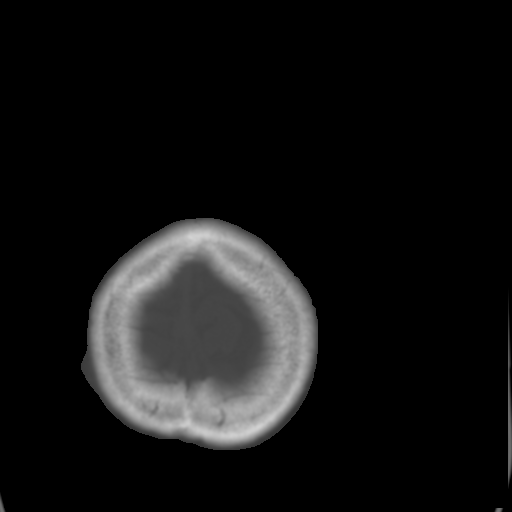
[im 29/36  brain]
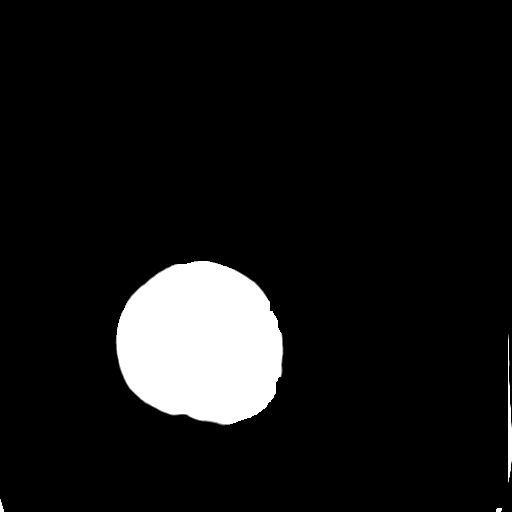
[im 32/36  brain]
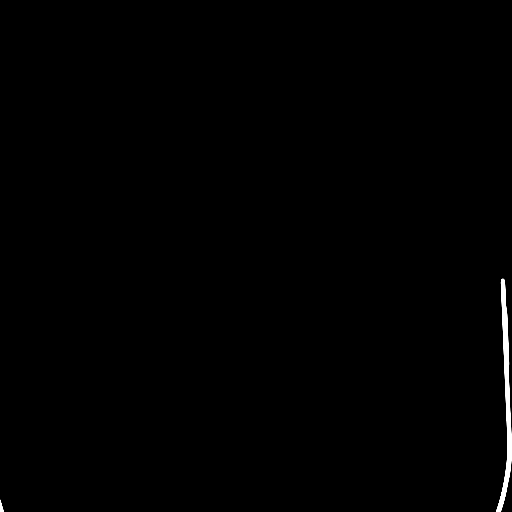
[im 34/36  brain]
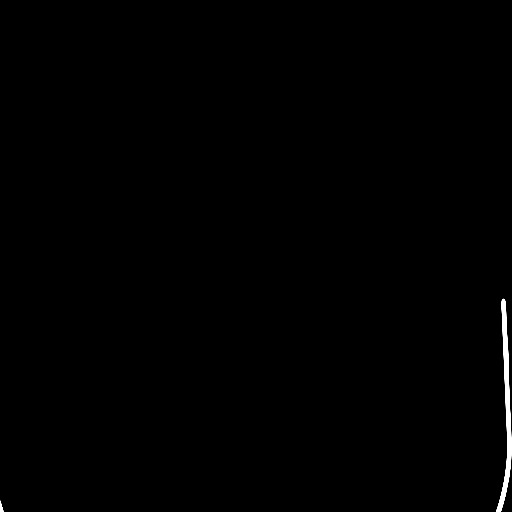

[16 of 30 positions shown; findings below may reference images not displayed]

FINDINGS: There is mild soft tissue thickening in the high right parietal
scalp. The calvarium and skullbase are intact.

There is no intracranial hemorrhage, mass or evidence of acute
infarction. There is no extra-axial fluid collection. Gray matter
and white matter appear normal. Cerebral volume is normal for age.
Brainstem and posterior fossa are unremarkable. The CSF spaces
appear normal.
IMPRESSION: Negative for acute intracranial traumatic injury.  Normal brain.

## 2017-03-02 DIAGNOSIS — M545 Low back pain: Secondary | ICD-10-CM | POA: Diagnosis not present

## 2017-03-02 DIAGNOSIS — M5136 Other intervertebral disc degeneration, lumbar region: Secondary | ICD-10-CM | POA: Diagnosis not present

## 2017-03-02 DIAGNOSIS — Z1389 Encounter for screening for other disorder: Secondary | ICD-10-CM | POA: Diagnosis not present

## 2017-03-02 DIAGNOSIS — M5410 Radiculopathy, site unspecified: Secondary | ICD-10-CM | POA: Diagnosis not present

## 2017-03-02 DIAGNOSIS — Z6821 Body mass index (BMI) 21.0-21.9, adult: Secondary | ICD-10-CM | POA: Diagnosis not present

## 2017-03-08 ENCOUNTER — Other Ambulatory Visit (HOSPITAL_COMMUNITY): Payer: Self-pay | Admitting: Family Medicine

## 2017-03-08 DIAGNOSIS — M545 Low back pain: Secondary | ICD-10-CM

## 2017-03-08 DIAGNOSIS — M5410 Radiculopathy, site unspecified: Secondary | ICD-10-CM

## 2017-03-08 DIAGNOSIS — M5136 Other intervertebral disc degeneration, lumbar region: Secondary | ICD-10-CM

## 2017-03-15 DIAGNOSIS — M542 Cervicalgia: Secondary | ICD-10-CM | POA: Diagnosis not present

## 2017-03-15 DIAGNOSIS — M1811 Unilateral primary osteoarthritis of first carpometacarpal joint, right hand: Secondary | ICD-10-CM | POA: Diagnosis not present

## 2017-03-15 DIAGNOSIS — M19041 Primary osteoarthritis, right hand: Secondary | ICD-10-CM | POA: Diagnosis not present

## 2017-03-15 DIAGNOSIS — M65331 Trigger finger, right middle finger: Secondary | ICD-10-CM | POA: Diagnosis not present

## 2017-03-15 DIAGNOSIS — M65341 Trigger finger, right ring finger: Secondary | ICD-10-CM | POA: Diagnosis not present

## 2017-03-24 DIAGNOSIS — M5412 Radiculopathy, cervical region: Secondary | ICD-10-CM | POA: Diagnosis not present

## 2017-03-25 DIAGNOSIS — Z1211 Encounter for screening for malignant neoplasm of colon: Secondary | ICD-10-CM | POA: Diagnosis not present

## 2017-03-30 ENCOUNTER — Ambulatory Visit (HOSPITAL_COMMUNITY): Payer: BLUE CROSS/BLUE SHIELD

## 2017-03-31 DIAGNOSIS — M542 Cervicalgia: Secondary | ICD-10-CM | POA: Diagnosis not present

## 2017-03-31 DIAGNOSIS — R200 Anesthesia of skin: Secondary | ICD-10-CM | POA: Diagnosis not present

## 2017-03-31 DIAGNOSIS — M65341 Trigger finger, right ring finger: Secondary | ICD-10-CM | POA: Diagnosis not present

## 2017-03-31 DIAGNOSIS — M1811 Unilateral primary osteoarthritis of first carpometacarpal joint, right hand: Secondary | ICD-10-CM | POA: Diagnosis not present

## 2017-04-28 DIAGNOSIS — M65341 Trigger finger, right ring finger: Secondary | ICD-10-CM | POA: Diagnosis not present

## 2019-02-21 DIAGNOSIS — M545 Low back pain: Secondary | ICD-10-CM | POA: Diagnosis not present

## 2019-02-21 DIAGNOSIS — M5432 Sciatica, left side: Secondary | ICD-10-CM | POA: Diagnosis not present

## 2019-02-21 DIAGNOSIS — Z6822 Body mass index (BMI) 22.0-22.9, adult: Secondary | ICD-10-CM | POA: Diagnosis not present

## 2019-02-21 DIAGNOSIS — Z1389 Encounter for screening for other disorder: Secondary | ICD-10-CM | POA: Diagnosis not present

## 2019-02-21 DIAGNOSIS — R319 Hematuria, unspecified: Secondary | ICD-10-CM | POA: Diagnosis not present

## 2019-03-06 DIAGNOSIS — M5136 Other intervertebral disc degeneration, lumbar region: Secondary | ICD-10-CM | POA: Diagnosis not present

## 2019-03-06 DIAGNOSIS — Z6823 Body mass index (BMI) 23.0-23.9, adult: Secondary | ICD-10-CM | POA: Diagnosis not present

## 2019-03-06 DIAGNOSIS — Z1389 Encounter for screening for other disorder: Secondary | ICD-10-CM | POA: Diagnosis not present

## 2019-03-06 DIAGNOSIS — M5410 Radiculopathy, site unspecified: Secondary | ICD-10-CM | POA: Diagnosis not present

## 2019-05-06 ENCOUNTER — Other Ambulatory Visit: Payer: PRIVATE HEALTH INSURANCE

## 2019-05-06 ENCOUNTER — Other Ambulatory Visit: Payer: Self-pay

## 2019-05-06 DIAGNOSIS — Z20822 Contact with and (suspected) exposure to covid-19: Secondary | ICD-10-CM

## 2019-05-06 DIAGNOSIS — R6889 Other general symptoms and signs: Secondary | ICD-10-CM | POA: Diagnosis not present

## 2019-05-10 DIAGNOSIS — M549 Dorsalgia, unspecified: Secondary | ICD-10-CM | POA: Diagnosis not present

## 2019-05-10 DIAGNOSIS — Z6822 Body mass index (BMI) 22.0-22.9, adult: Secondary | ICD-10-CM | POA: Diagnosis not present

## 2019-05-10 DIAGNOSIS — R5383 Other fatigue: Secondary | ICD-10-CM | POA: Diagnosis not present

## 2019-05-10 DIAGNOSIS — M791 Myalgia, unspecified site: Secondary | ICD-10-CM | POA: Diagnosis not present

## 2019-05-10 DIAGNOSIS — Z1389 Encounter for screening for other disorder: Secondary | ICD-10-CM | POA: Diagnosis not present

## 2019-05-11 LAB — NOVEL CORONAVIRUS, NAA: SARS-CoV-2, NAA: NOT DETECTED

## 2019-05-15 ENCOUNTER — Other Ambulatory Visit: Payer: Self-pay | Admitting: Family Medicine

## 2019-05-15 ENCOUNTER — Other Ambulatory Visit (HOSPITAL_COMMUNITY): Payer: Self-pay | Admitting: Family Medicine

## 2019-05-15 DIAGNOSIS — M549 Dorsalgia, unspecified: Secondary | ICD-10-CM

## 2019-05-15 DIAGNOSIS — M791 Myalgia, unspecified site: Secondary | ICD-10-CM

## 2019-05-17 ENCOUNTER — Ambulatory Visit (HOSPITAL_COMMUNITY): Admission: RE | Admit: 2019-05-17 | Payer: BC Managed Care – PPO | Source: Ambulatory Visit

## 2019-05-17 DIAGNOSIS — E559 Vitamin D deficiency, unspecified: Secondary | ICD-10-CM | POA: Diagnosis not present

## 2019-05-17 DIAGNOSIS — Z6822 Body mass index (BMI) 22.0-22.9, adult: Secondary | ICD-10-CM | POA: Diagnosis not present

## 2019-05-20 DIAGNOSIS — D8989 Other specified disorders involving the immune mechanism, not elsewhere classified: Secondary | ICD-10-CM | POA: Diagnosis not present

## 2019-05-20 DIAGNOSIS — M79643 Pain in unspecified hand: Secondary | ICD-10-CM | POA: Diagnosis not present

## 2019-05-20 DIAGNOSIS — M8580 Other specified disorders of bone density and structure, unspecified site: Secondary | ICD-10-CM | POA: Diagnosis not present

## 2019-05-20 DIAGNOSIS — M79642 Pain in left hand: Secondary | ICD-10-CM | POA: Diagnosis not present

## 2019-05-20 DIAGNOSIS — M79641 Pain in right hand: Secondary | ICD-10-CM | POA: Diagnosis not present

## 2019-05-20 DIAGNOSIS — M19042 Primary osteoarthritis, left hand: Secondary | ICD-10-CM | POA: Diagnosis not present

## 2019-05-20 DIAGNOSIS — M1990 Unspecified osteoarthritis, unspecified site: Secondary | ICD-10-CM | POA: Diagnosis not present

## 2019-05-20 DIAGNOSIS — M19041 Primary osteoarthritis, right hand: Secondary | ICD-10-CM | POA: Diagnosis not present

## 2019-05-20 DIAGNOSIS — M359 Systemic involvement of connective tissue, unspecified: Secondary | ICD-10-CM | POA: Diagnosis not present

## 2019-05-23 ENCOUNTER — Ambulatory Visit (HOSPITAL_COMMUNITY)
Admission: RE | Admit: 2019-05-23 | Discharge: 2019-05-23 | Disposition: A | Discharge status: Home / Self Care | Payer: BC Managed Care – PPO | Source: Ambulatory Visit | Attending: Family Medicine | Admitting: Family Medicine

## 2019-05-23 ENCOUNTER — Other Ambulatory Visit: Payer: Self-pay

## 2019-05-23 DIAGNOSIS — M791 Myalgia, unspecified site: Secondary | ICD-10-CM | POA: Diagnosis not present

## 2019-05-23 DIAGNOSIS — M549 Dorsalgia, unspecified: Secondary | ICD-10-CM

## 2019-05-23 DIAGNOSIS — M545 Low back pain: Secondary | ICD-10-CM | POA: Diagnosis not present

## 2019-06-03 DIAGNOSIS — M1990 Unspecified osteoarthritis, unspecified site: Secondary | ICD-10-CM | POA: Diagnosis not present

## 2019-06-03 DIAGNOSIS — M8580 Other specified disorders of bone density and structure, unspecified site: Secondary | ICD-10-CM | POA: Diagnosis not present

## 2019-06-03 DIAGNOSIS — M79643 Pain in unspecified hand: Secondary | ICD-10-CM | POA: Diagnosis not present

## 2019-06-03 DIAGNOSIS — M810 Age-related osteoporosis without current pathological fracture: Secondary | ICD-10-CM | POA: Diagnosis not present

## 2019-06-03 DIAGNOSIS — M549 Dorsalgia, unspecified: Secondary | ICD-10-CM | POA: Diagnosis not present

## 2019-06-04 DIAGNOSIS — Z1389 Encounter for screening for other disorder: Secondary | ICD-10-CM | POA: Diagnosis not present

## 2019-06-04 DIAGNOSIS — M5136 Other intervertebral disc degeneration, lumbar region: Secondary | ICD-10-CM | POA: Diagnosis not present

## 2019-06-04 DIAGNOSIS — Z6822 Body mass index (BMI) 22.0-22.9, adult: Secondary | ICD-10-CM | POA: Diagnosis not present

## 2019-06-19 ENCOUNTER — Other Ambulatory Visit (HOSPITAL_COMMUNITY): Payer: Self-pay | Admitting: Family Medicine

## 2019-06-19 DIAGNOSIS — Z1231 Encounter for screening mammogram for malignant neoplasm of breast: Secondary | ICD-10-CM

## 2019-06-19 DIAGNOSIS — M47816 Spondylosis without myelopathy or radiculopathy, lumbar region: Secondary | ICD-10-CM | POA: Diagnosis not present

## 2019-06-27 ENCOUNTER — Ambulatory Visit (HOSPITAL_COMMUNITY)
Admission: RE | Admit: 2019-06-27 | Discharge: 2019-06-27 | Disposition: A | Discharge status: Home / Self Care | Payer: BC Managed Care – PPO | Source: Ambulatory Visit | Attending: Family Medicine | Admitting: Family Medicine

## 2019-06-27 ENCOUNTER — Other Ambulatory Visit: Payer: Self-pay

## 2019-06-27 DIAGNOSIS — Z1231 Encounter for screening mammogram for malignant neoplasm of breast: Secondary | ICD-10-CM

## 2020-10-04 IMAGING — MR MRI LUMBAR SPINE WITHOUT CONTRAST
4 of 5 series · 16 of 48 positions shown · non-contrast
Comparison: Lumbar radiographs 04/20/2016

CLINICAL DATA: Low back pain with bilateral leg pain

EXAM:
MRI LUMBAR SPINE WITHOUT CONTRAST
TECHNIQUE: Multiplanar, multisequence MR imaging of the lumbar spine was
performed. No intravenous contrast was administered.

[Series 3: T2 · sagittal · 4.0mm · 0.72mm/px · 7 of 15 slices shown (1 of 2)]
[im 1/15]
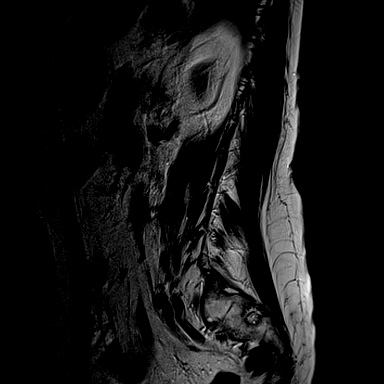
[im 3/15]
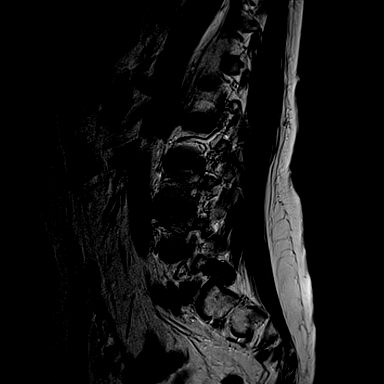
[im 5/15]
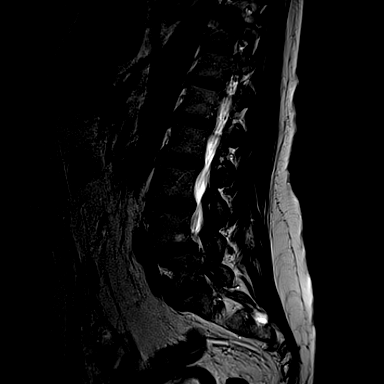
[im 8/15]
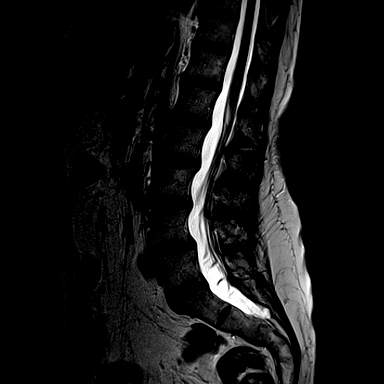
[im 10/15]
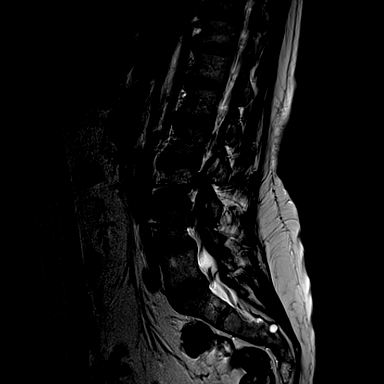
[im 12/15]
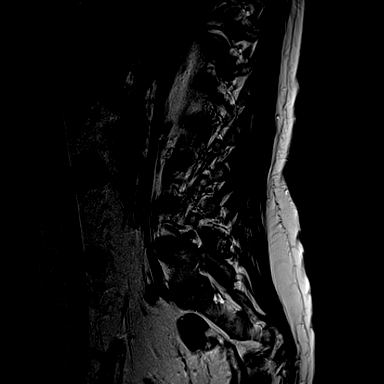
[im 15/15]
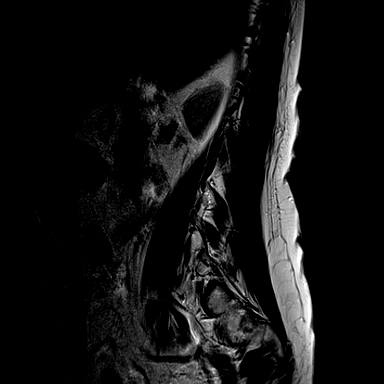

[Series 4: T1 · sagittal · 4.0mm · 0.36mm/px · 3 of 15 slices shown (1 of 2)]
[im 3/15]
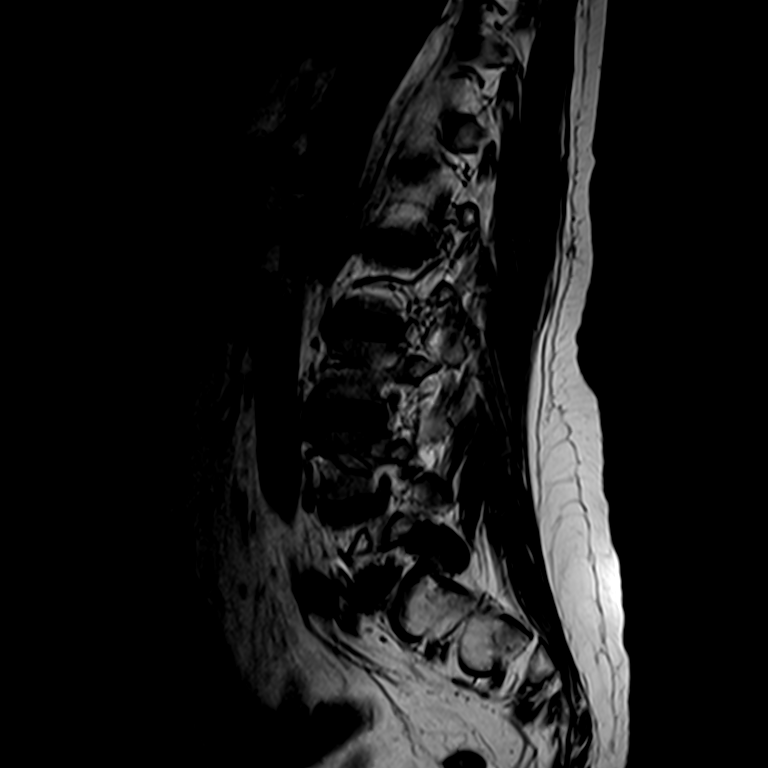
[im 8/15]
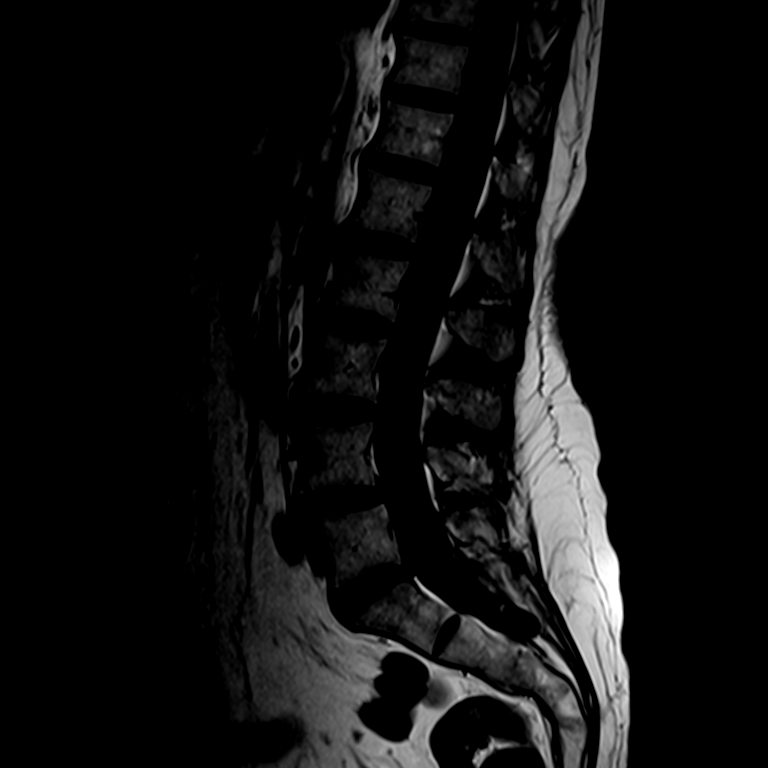
[im 12/15]
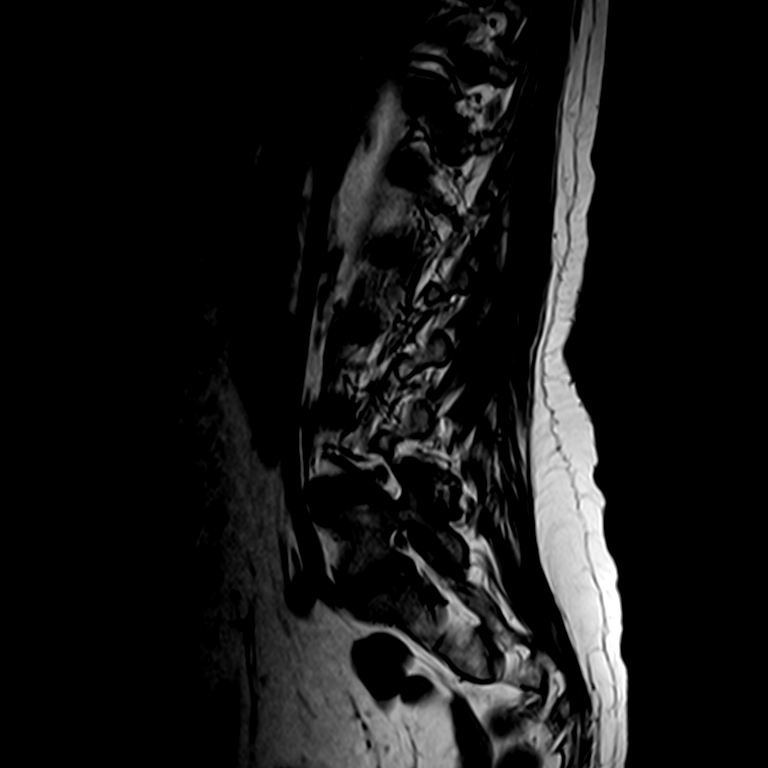

[Series 6: T2 · axial · 4.0mm · 0.22mm/px · z∈[-54,+61]mm · 3 of 34 slices shown (2 of 2)]
[im 6/34]
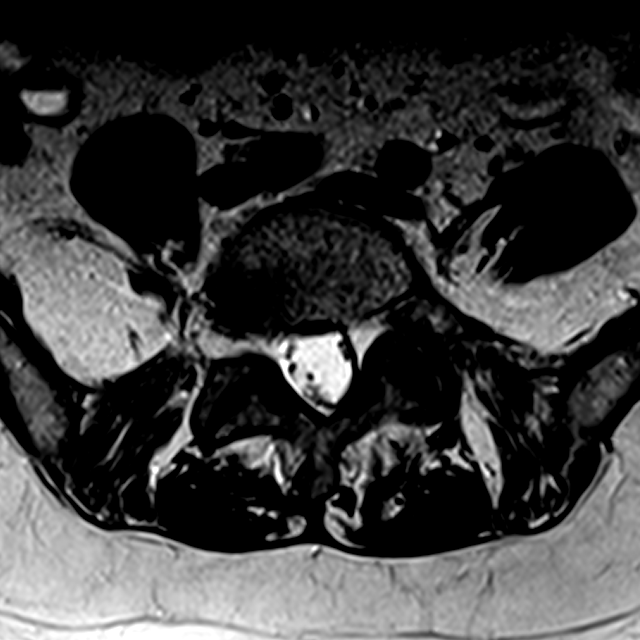
[im 18/34]
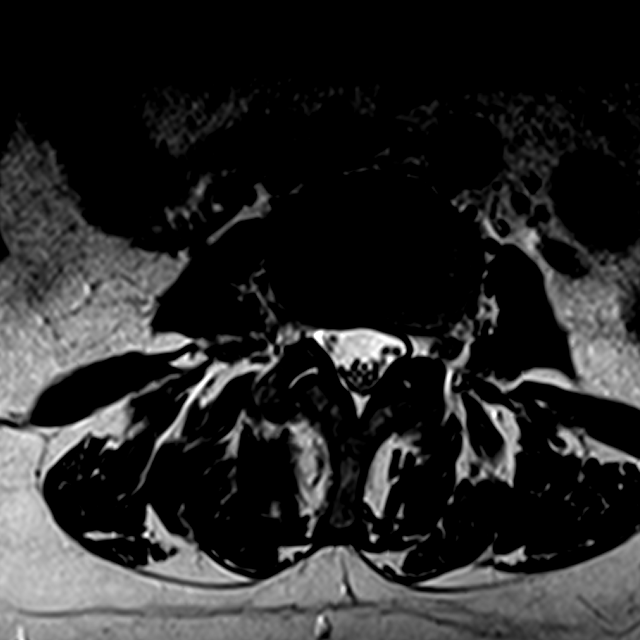
[im 28/34]
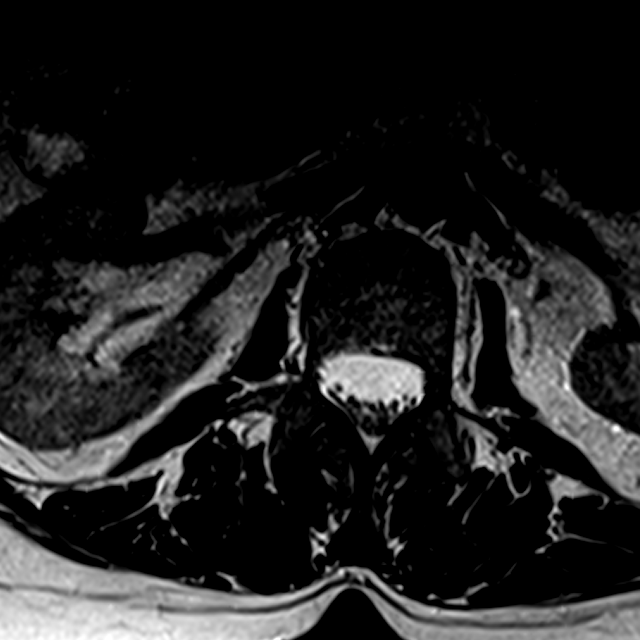

[Series 7: T1 · axial · 4.0mm · 0.22mm/px · z∈[-54,+61]mm · 3 of 34 slices shown (2 of 2)]
[im 6/34]
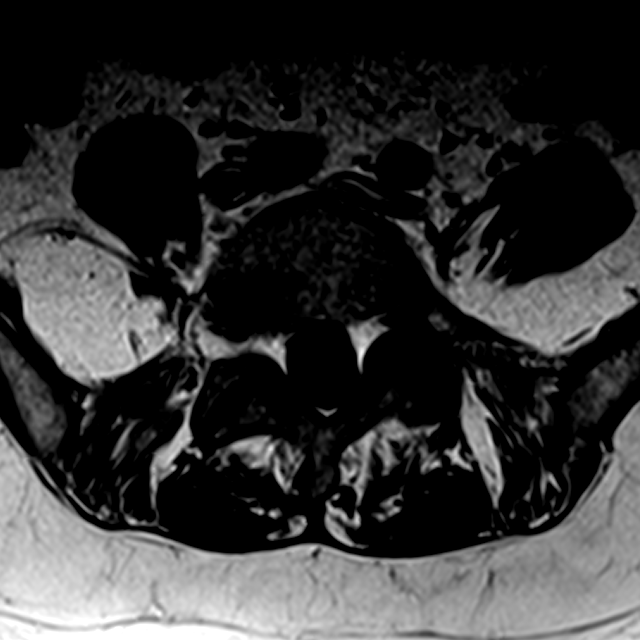
[im 18/34]
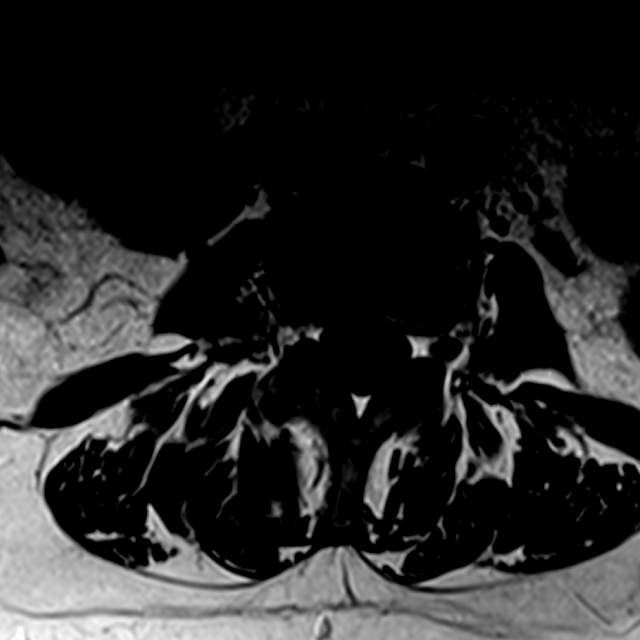
[im 28/34]
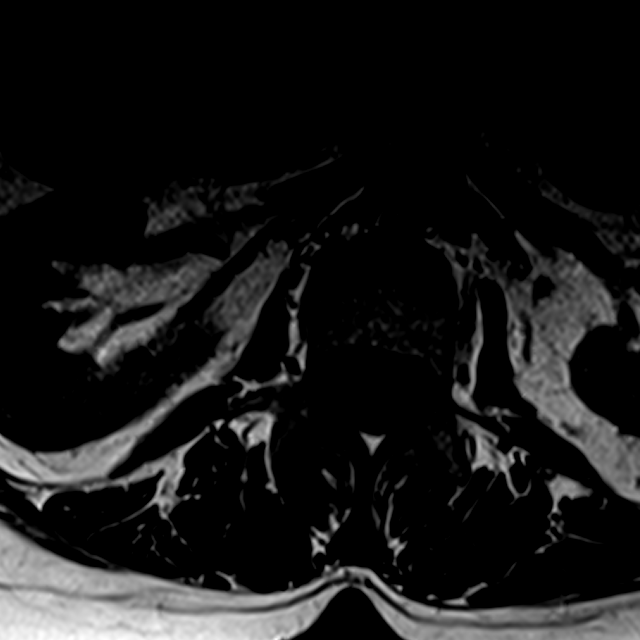

[16 of 48 positions shown; findings below may reference images not displayed]

FINDINGS: Segmentation:  Normal

Alignment: 2 mm anterolisthesis L4-5 has developed since the prior
study. Remaining alignment normal

Vertebrae: Negative for fracture or mass. Hemangioma L3 vertebral
body on the right.

Conus medullaris and cauda equina: Conus extends to the L1-2 level.
Conus and cauda equina appear normal.

Paraspinal and other soft tissues: Negative for paraspinous mass or
adenopathy.

Disc levels:

L1-2: Negative

L2-3: Mild disc degeneration and disc bulging without stenosis

L3-4: Mild disc degeneration. Negative for disc protrusion or
stenosis.

L4-5: Slight anterolisthesis with mild disc and facet degeneration.
Negative for spinal or foraminal stenosis.

L5-S1: Mild disc degeneration with endplate spurring. Left facet
hypertrophy. Moderate left foraminal encroachment with impingement
left L5 nerve root.
IMPRESSION: Moderate left foraminal encroachment due to spurring at L5-S1 with
impingement left L5 nerve root

Mild degenerative changes elsewhere as above.

## 2021-06-15 ENCOUNTER — Other Ambulatory Visit (HOSPITAL_COMMUNITY): Payer: Self-pay | Admitting: Family Medicine

## 2021-06-15 DIAGNOSIS — Z1231 Encounter for screening mammogram for malignant neoplasm of breast: Secondary | ICD-10-CM

## 2021-06-16 ENCOUNTER — Other Ambulatory Visit: Payer: Self-pay

## 2021-06-16 ENCOUNTER — Ambulatory Visit (HOSPITAL_COMMUNITY)
Admission: RE | Admit: 2021-06-16 | Discharge: 2021-06-16 | Disposition: A | Discharge status: Home / Self Care | Payer: 59 | Source: Ambulatory Visit | Attending: Family Medicine | Admitting: Family Medicine

## 2021-06-16 DIAGNOSIS — Z1231 Encounter for screening mammogram for malignant neoplasm of breast: Secondary | ICD-10-CM | POA: Diagnosis present

## 2021-06-23 ENCOUNTER — Encounter (INDEPENDENT_AMBULATORY_CARE_PROVIDER_SITE_OTHER): Payer: Self-pay | Admitting: *Deleted

## 2021-10-19 ENCOUNTER — Encounter: Payer: Self-pay | Admitting: Emergency Medicine

## 2021-10-19 ENCOUNTER — Other Ambulatory Visit: Payer: Self-pay

## 2021-10-19 ENCOUNTER — Emergency Department (HOSPITAL_COMMUNITY): Payer: 59

## 2021-10-19 ENCOUNTER — Emergency Department (HOSPITAL_COMMUNITY)
Admission: EM | Admit: 2021-10-19 | Discharge: 2021-10-19 | Disposition: A | Discharge status: Home / Self Care | Payer: 59 | Attending: Emergency Medicine | Admitting: Emergency Medicine

## 2021-10-19 ENCOUNTER — Encounter (HOSPITAL_COMMUNITY): Payer: Self-pay | Admitting: *Deleted

## 2021-10-19 ENCOUNTER — Ambulatory Visit
Admission: EM | Admit: 2021-10-19 | Discharge: 2021-10-19 | Disposition: A | Discharge status: Home / Self Care | Payer: 59

## 2021-10-19 DIAGNOSIS — Y92410 Unspecified street and highway as the place of occurrence of the external cause: Secondary | ICD-10-CM | POA: Diagnosis not present

## 2021-10-19 DIAGNOSIS — R110 Nausea: Secondary | ICD-10-CM | POA: Diagnosis not present

## 2021-10-19 DIAGNOSIS — R519 Headache, unspecified: Secondary | ICD-10-CM | POA: Insufficient documentation

## 2021-10-19 DIAGNOSIS — F17210 Nicotine dependence, cigarettes, uncomplicated: Secondary | ICD-10-CM | POA: Diagnosis not present

## 2021-10-19 DIAGNOSIS — H538 Other visual disturbances: Secondary | ICD-10-CM | POA: Diagnosis not present

## 2021-10-19 LAB — CBC WITH DIFFERENTIAL/PLATELET
Abs Immature Granulocytes: 0.01 10*3/uL (ref 0.00–0.07)
Basophils Absolute: 0 10*3/uL (ref 0.0–0.1)
Basophils Relative: 1 %
Eosinophils Absolute: 0 10*3/uL (ref 0.0–0.5)
Eosinophils Relative: 1 %
HCT: 42.5 % (ref 36.0–46.0)
Hemoglobin: 14.2 g/dL (ref 12.0–15.0)
Immature Granulocytes: 0 %
Lymphocytes Relative: 29 %
Lymphs Abs: 1.4 10*3/uL (ref 0.7–4.0)
MCH: 32.3 pg (ref 26.0–34.0)
MCHC: 33.4 g/dL (ref 30.0–36.0)
MCV: 96.6 fL (ref 80.0–100.0)
Monocytes Absolute: 0.3 10*3/uL (ref 0.1–1.0)
Monocytes Relative: 7 %
Neutro Abs: 3 10*3/uL (ref 1.7–7.7)
Neutrophils Relative %: 62 %
Platelets: 265 10*3/uL (ref 150–400)
RBC: 4.4 MIL/uL (ref 3.87–5.11)
RDW: 12.1 % (ref 11.5–15.5)
WBC: 4.8 10*3/uL (ref 4.0–10.5)
nRBC: 0 % (ref 0.0–0.2)

## 2021-10-19 LAB — BASIC METABOLIC PANEL
Anion gap: 8 (ref 5–15)
BUN: 9 mg/dL (ref 8–23)
CO2: 26 mmol/L (ref 22–32)
Calcium: 8.8 mg/dL — ABNORMAL LOW (ref 8.9–10.3)
Chloride: 104 mmol/L (ref 98–111)
Creatinine, Ser: 0.46 mg/dL (ref 0.44–1.00)
GFR, Estimated: >60 mL/min (ref >60)
Glucose, Bld: 100 mg/dL — ABNORMAL HIGH (ref 70–99)
Potassium: 3.4 mmol/L — ABNORMAL LOW (ref 3.5–5.1)
Sodium: 138 mmol/L (ref 135–145)

## 2021-10-19 MED ORDER — ACETAMINOPHEN 325 MG PO TABS
650.0000 mg | ORAL_TABLET | Freq: Once | ORAL | Status: AC
Start: 2021-10-19 — End: 2021-10-19
  Administered 2021-10-19: 13:00:00 650 mg via ORAL
  Filled 2021-10-19: qty 2

## 2021-10-19 MED ORDER — IOHEXOL 350 MG/ML SOLN
100.0000 mL | Freq: Once | INTRAVENOUS | Status: AC | PRN
  Administered 2021-10-19: 17:00:00 75 mL via INTRAVENOUS

## 2021-10-19 NOTE — ED Notes (Signed)
Reports blurred vision in right eye this morning. Co headache and neck pain.
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
## 2021-10-19 NOTE — Discharge Instructions (Addendum)
Follow-up with neurology for additional evaluation in a week. Follow-up with your ophthalmologist for eye recheck in the next week or so. Return if things change or worsen.
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
## 2021-10-19 NOTE — ED Notes (Signed)
Spoke to [employer] at [facility] ED to let her know patient is coming to [facility] ED
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
## 2021-10-19 NOTE — ED Notes (Signed)
Advised pt to keep a bp diary and if bp consistently over 140/90 to see her PCP and take her bp diary with her. Pt verbalizes understanding.
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
## 2021-10-19 NOTE — Discharge Instructions (Addendum)
-  Please head straight to the ED. You need a scan of your brain to rule out trauma or even a bleed. Have a family member or friend drive you. Call 911 if symptoms worsen/change on the day.
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
## 2021-11-04 ENCOUNTER — Ambulatory Visit: Payer: Self-pay | Admitting: Neurology

## 2021-12-02 ENCOUNTER — Encounter (INDEPENDENT_AMBULATORY_CARE_PROVIDER_SITE_OTHER): Payer: Self-pay | Admitting: *Deleted

## 2022-03-21 ENCOUNTER — Encounter: Payer: Self-pay | Admitting: Emergency Medicine

## 2022-03-21 ENCOUNTER — Ambulatory Visit
Admission: EM | Admit: 2022-03-21 | Discharge: 2022-03-21 | Disposition: A | Discharge status: Home / Self Care | Payer: PRIVATE HEALTH INSURANCE

## 2022-03-21 ENCOUNTER — Ambulatory Visit (INDEPENDENT_AMBULATORY_CARE_PROVIDER_SITE_OTHER): Payer: PRIVATE HEALTH INSURANCE

## 2022-03-21 DIAGNOSIS — S161XXA Strain of muscle, fascia and tendon at neck level, initial encounter: Secondary | ICD-10-CM | POA: Diagnosis not present

## 2022-03-21 DIAGNOSIS — M542 Cervicalgia: Secondary | ICD-10-CM

## 2022-03-21 MED ORDER — IBUPROFEN 800 MG PO TABS: 800.0000 mg | ORAL_TABLET | Freq: Three times a day (TID) | ORAL | 0 refills | Status: AC

## 2022-03-21 MED ORDER — TIZANIDINE HCL 4 MG PO TABS: 4.0000 mg | ORAL_TABLET | Freq: Four times a day (QID) | ORAL | 0 refills | Status: DC | PRN

## 2022-03-21 NOTE — ED Provider Notes (Signed)
[facility]    CSN: [id number] Arrival date & time: 03/21/22  1146      History   Chief Complaint No chief complaint on file.   HPI Christy Irwin is a 61 y.o. female.   The patient is a 61 year old female who presents with left-sided neck pain.  Patient states symptoms have been present since she fell 2 weeks ago.  Pain is now moving into her posterior shoulder.  She complains of pain with movement of her neck.  She does have range of motion of her neck.  Patient states that pain is worse at night and has impacted her sleeping.  She denies inability to move her neck.  She does complain of numbness and tingling that radiates down into the left arm.  She has been using Salonpas to the affected area, but states that she has since developed skin irritation and has stopped using it.  She was also using IcyHot, but she cannot tolerate it after the skin irritation from the [place].  The history is provided by the patient.   Past Medical History:  Diagnosis Date   Head ache     There are no problems to display for this patient.   Past Surgical History:  Procedure Laterality Date   ABDOMINAL HYSTERECTOMY      OB History     Gravida      Para      Term      Preterm      AB      Living  2      SAB      IAB      Ectopic      Multiple      Live Births               Home Medications    Prior to Admission medications   Medication Sig Start Date End Date Taking? Authorizing Provider  ibuprofen (ADVIL) 800 MG tablet Take 1 tablet (800 mg total) by mouth 3 (three) times daily. 03/21/22  Yes Irva Loser-Warren, Sadie Haberhristie J, NP  omeprazole (PRILOSEC) 10 MG capsule Take 10 mg by mouth daily.   Yes [provider]  tiZANidine (ZANAFLEX) 4 MG tablet Take 1 tablet (4 mg total) by mouth every 6 (six) hours as needed for muscle spasms. 03/21/22  Yes Harun Brumley-Warren, Sadie Haberhristie J, NP  diphenhydrAMINE (BENADRYL) 25 mg capsule Take 50 mg by mouth daily as  needed. For allergies     [provider]    Family History History reviewed. No pertinent family history.  Social History Social History   Tobacco Use   Smoking status: Some Days    Packs/day: 0.50    Types: Cigarettes  Substance Use Topics   Alcohol use: Yes   Drug use: No     Allergies   Bee venom   Review of Systems Review of Systems  Constitutional: Negative.   Musculoskeletal:  Positive for neck pain and neck stiffness.  Skin: Negative.   Psychiatric/Behavioral: Negative.      Physical Exam Triage Vital Signs ED Triage Vitals  Enc Vitals Group     BP 03/21/22 1354 (!) 165/88     Pulse Rate 03/21/22 1354 95     Resp 03/21/22 1354 18     Temp 03/21/22 1354 97.9 F (36.6 C)     Temp Source 03/21/22 1354 Oral     SpO2 03/21/22 1354 98 %     Weight --      Height --  Head Circumference --      Peak Flow --      Pain Score 03/21/22 1356 10     Pain Loc --      Pain Edu? --      Excl. in [place]? --    No data found.  Updated Vital Signs BP (!) 165/88 (BP Location: Right Arm)   Pulse 95   Temp 97.9 F (36.6 C) (Oral)   Resp 18   SpO2 98%   Visual Acuity Right Eye Distance:   Left Eye Distance:   Bilateral Distance:    Right Eye Near:   Left Eye Near:    Bilateral Near:     Physical Exam Vitals and nursing note reviewed.  Constitutional:      Appearance: Normal appearance.  HENT:     Head: Normocephalic.  Eyes:     Extraocular Movements: Extraocular movements intact.     Pupils: Pupils are equal, round, and reactive to light.  Cardiovascular:     Rate and Rhythm: Normal rate and regular rhythm.     Pulses: Normal pulses.     Heart sounds: Normal heart sounds.  Pulmonary:     Effort: Pulmonary effort is normal.     Breath sounds: Normal breath sounds.  Abdominal:     General: Bowel sounds are normal.     Palpations: Abdomen is soft.  Musculoskeletal:     Cervical back: Normal range of motion. Tenderness (left c-spine at  C7-C8) present.  Lymphadenopathy:     Cervical: No cervical adenopathy.  Skin:    General: Skin is warm and dry.     Capillary Refill: Capillary refill takes less than 2 seconds.  Neurological:     General: No focal deficit present.     Mental Status: She is alert and oriented to person, place, and time.  Psychiatric:        Mood and Affect: Mood normal.        Behavior: Behavior normal.     UC Treatments / Results  Labs (all labs ordered are listed, but only abnormal results are displayed) Labs Reviewed - No data to display  EKG   Radiology DG Cervical Spine Complete  Result Date: 03/21/2022 CLINICAL DATA:  LEFT-sided neck pain EXAM: CERVICAL SPINE - COMPLETE 4+ VIEW COMPARISON:  CT cervical spine 10/19/2021 FINDINGS: Normal alignment of vertebral bodies. Normal spinal laminal line. Mild neural foraminal narrowing at C6-C7 on LEFT and RIGHT as seen on comparison CT. Multilevel facet hypertrophy noted. IMPRESSION: 1. No acute findings cervical spine. 2. 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   On: 03/21/2022 14:31    Procedures Procedures (including critical care time)  Medications Ordered in UC Medications - No data to display  Initial Impression / Assessment and Plan / UC Course  I have reviewed the triage vital signs and the nursing notes.  Pertinent labs & imaging results that were available during my care of the patient were reviewed by me and considered in my medical decision making (see chart for details).  Patient is a 61 year old female who presents with left-sided neck pain after fall 2 weeks ago.  Her vital signs and exam are reassuring.  She does have full range of motion of the neck, but pain with left rotation and left lateral movement.  Pain radiates down into the left trapezius.  She also has tenderness to the left cervical spine around C7-C8. Her xrays are negative for  fracture or dislocation,  but does show degenerative changes not changed from her CT on 10/19/2021.Marland Kitchen Symptoms are consistent with a cervical strain/sprain. Will provide the patient a prescription for tizanidine and Etodolac. Recommended using ice/heat, instruction was provided. Advised patient that if symptoms do not improve, she will need to follow up with orthopedics/spine specialist. Patient verbalizes understanding.  Final Clinical Impressions(s) / UC Diagnoses   Final diagnoses:  Neck pain  Acute strain of neck muscle, initial encounter     Discharge Instructions      Your x-rays are negative for fracture or dislocation.  There are some degenerative changes that are seen on the x-ray that have not changed since her CT on 10/19/2021. Recommend using ice or heat as discussed.  Use ice for pain or swelling, heat for spasm or stiffness.  Apply for 20 minutes, remove for 1 hour, then repeat. May use over-the-counter Biofreeze to help with neck pain. Perform gentle range of motion to keep the joints and muscles of the neck mobile. If your symptoms worsen or do not improve, recommend following up with orthopedics, spine specialist for further evaluation.     ED Prescriptions     Medication Sig Dispense Auth. Provider   ibuprofen (ADVIL) 800 MG tablet Take 1 tablet (800 mg total) by mouth 3 (three) times daily. 30 tablet Eero Dini-Warren, Sadie Haber, NP   tiZANidine (ZANAFLEX) 4 MG tablet Take 1 tablet (4 mg total) by mouth every 6 (six) hours as needed for muscle spasms. 30 tablet Kymberlie Brazeau-Warren, Sadie Haber, NP      PDMP not reviewed this encounter.   Abran Cantor, NP 03/21/22 1447

## 2022-03-21 NOTE — ED Triage Notes (Signed)
Fell 2 weeks ago left sided neck pain.  Was using salonpas and developed a blister and had to quite using.  This was helping with pain

## 2022-03-21 NOTE — Discharge Instructions (Addendum)
Your x-rays are negative for fracture or dislocation.  There are some degenerative changes that are seen on the x-ray that have not changed since her CT on 10/19/2021. Recommend using ice or heat as discussed.  Use ice for pain or swelling, heat for spasm or stiffness.  Apply for 20 minutes, remove for 1 hour, then repeat. May use over-the-counter Biofreeze to help with neck pain. Perform gentle range of motion to keep the joints and muscles of the neck mobile. If your symptoms worsen or do not improve, recommend following up with orthopedics, spine specialist for further evaluation.

## 2022-11-30 ENCOUNTER — Encounter: Payer: Self-pay | Admitting: Emergency Medicine

## 2022-11-30 ENCOUNTER — Ambulatory Visit
Admission: EM | Admit: 2022-11-30 | Discharge: 2022-11-30 | Disposition: A | Discharge status: Home / Self Care | Payer: 59 | Attending: Family Medicine | Admitting: Family Medicine

## 2022-11-30 ENCOUNTER — Other Ambulatory Visit: Payer: Self-pay

## 2022-11-30 ENCOUNTER — Ambulatory Visit (INDEPENDENT_AMBULATORY_CARE_PROVIDER_SITE_OTHER): Payer: 59

## 2022-11-30 DIAGNOSIS — M25552 Pain in left hip: Secondary | ICD-10-CM

## 2022-11-30 DIAGNOSIS — W19XXXA Unspecified fall, initial encounter: Secondary | ICD-10-CM

## 2022-11-30 MED ORDER — TIZANIDINE HCL 4 MG PO TABS: 4.0000 mg | ORAL_TABLET | Freq: Three times a day (TID) | ORAL | 0 refills | Status: AC | PRN

## 2022-11-30 NOTE — ED Provider Notes (Signed)
[facility]    CSN: [id number] Arrival date & time: 11/30/22  1538      History   Chief Complaint Chief Complaint  Patient presents with   Hip Pain    HPI Christy Irwin is a 62 y.o. female.   Patient presenting today with 1 day history of left anterior and lateral hip pain/stiffness after a fall that occurred earlier today onto her left hip.  She denies decreased range of motion and particularly when sitting still has minimal pain but states she was told she has osteoporosis and would need to get an x-ray anytime she fell going forward as there was a high chance of fracture.  She denies weakness, numbness, tingling, discoloration, head injury, loss of consciousness.  Not trying anything over-the-counter for symptoms.    Past Medical History:  Diagnosis Date   Head ache     There are no problems to display for this patient.   Past Surgical History:  Procedure Laterality Date   ABDOMINAL HYSTERECTOMY      OB History     Gravida      Para      Term      Preterm      AB      Living  2      SAB      IAB      Ectopic      Multiple      Live Births               Home Medications    Prior to Admission medications   Medication Sig Start Date End Date Taking? Authorizing Provider  diphenhydrAMINE (BENADRYL) 25 mg capsule Take 50 mg by mouth daily as needed. For allergies     [provider]  ibuprofen (ADVIL) 800 MG tablet Take 1 tablet (800 mg total) by mouth 3 (three) times daily. 03/21/22   Leath-Warren, Alda Lea, NP  omeprazole (PRILOSEC) 10 MG capsule Take 10 mg by mouth daily.    [provider]  tiZANidine (ZANAFLEX) 4 MG tablet Take 1 tablet (4 mg total) by mouth every 8 (eight) hours as needed for muscle spasms. Do not drink alcohol or drive while on this medication.  May cause drowsiness. 11/30/22   Volney American, PA-C    Family History History reviewed. No pertinent family history.  Social  History Social History   Tobacco Use   Smoking status: Some Days    Packs/day: 0.25    Types: Cigarettes  Substance Use Topics   Alcohol use: Yes    Comment: daily   Drug use: No     Allergies   Bee venom   Review of Systems Review of Systems Per HPI  Physical Exam Triage Vital Signs ED Triage Vitals  Enc Vitals Group     BP 11/30/22 1550 115/76     Pulse Rate 11/30/22 1550 83     Resp 11/30/22 1550 20     Temp 11/30/22 1550 98.7 F (37.1 C)     Temp Source 11/30/22 1550 Oral     SpO2 11/30/22 1550 93 %     Weight --      Height --      Head Circumference --      Peak Flow --      Pain Score 11/30/22 1547 6     Pain Loc --      Pain Edu? --      Excl. in [place]? --  No data found.  Updated Vital Signs BP 115/76 (BP Location: Right Arm)   Pulse 83   Temp 98.7 F (37.1 C) (Oral)   Resp 20   SpO2 93%   Visual Acuity Right Eye Distance:   Left Eye Distance:   Bilateral Distance:    Right Eye Near:   Left Eye Near:    Bilateral Near:     Physical Exam Vitals and nursing note reviewed.  Constitutional:      Appearance: Normal appearance. She is not ill-appearing.  HENT:     Head: Atraumatic.  Eyes:     Extraocular Movements: Extraocular movements intact.     Conjunctiva/sclera: Conjunctivae normal.  Cardiovascular:     Rate and Rhythm: Normal rate and regular rhythm.     Heart sounds: Normal heart sounds.  Pulmonary:     Effort: Pulmonary effort is normal.     Breath sounds: Normal breath sounds.  Musculoskeletal:        General: Signs of injury present. No swelling or deformity. Normal range of motion.     Cervical back: Normal range of motion and neck supple.     Comments: Minimally antalgic gait with walking but able to get up out of chair quickly and without difficulty and adduct and abduct the left hip with no difficulty  Skin:    General: Skin is warm and dry.  Neurological:     Mental Status: She is alert and oriented to person, place,  and time.     Comments: Bilateral lower extremities neurovascularly intact  Psychiatric:        Mood and Affect: Mood normal.        Thought Content: Thought content normal.        Judgment: Judgment normal.      UC Treatments / Results  Labs (all labs ordered are listed, but only abnormal results are displayed) Labs Reviewed - No data to display  EKG   Radiology DG Hip Unilat With Pelvis 2-3 Views Left  Result Date: 11/30/2022 CLINICAL DATA:  Left hip pain. EXAM: DG HIP (WITH OR WITHOUT PELVIS) 2-3V LEFT COMPARISON:  None Available. FINDINGS: There is no evidence of hip fracture or dislocation. Mild osteoarthritis of both hips. Pelvic film shows no evidence fracture, bony lesions or diastasis. IMPRESSION: Mild osteoarthritis of both hips. Electronically Signed   By: Aletta Edouard M.D.   On: 11/30/2022 16:43    Procedures Procedures (including critical care time)  Medications Ordered in UC Medications - No data to display  Initial Impression / Assessment and Plan / UC Course  I have reviewed the triage vital signs and the nursing notes.  Pertinent labs & imaging results that were available during my care of the patient were reviewed by me and considered in my medical decision making (see chart for details).     Vitals and exam very reassuring today, x-ray of the left hip negative for acute bony abnormality.  Reassurance given, discussed Zanaflex, anti-inflammatories, heat, massage, stretches.  Return for worsening symptoms.  Final Clinical Impressions(s) / UC Diagnoses   Final diagnoses:  Left hip pain  Fall, initial encounter   Discharge Instructions   None    ED Prescriptions     Medication Sig Dispense Auth. Provider   tiZANidine (ZANAFLEX) 4 MG tablet Take 1 tablet (4 mg total) by mouth every 8 (eight) hours as needed for muscle spasms. Do not drink alcohol or drive while on this medication.  May cause drowsiness. 15 tablet Merrie Roof  Benjamine Mola, PA-C       PDMP not reviewed this encounter.   Volney American, [place] 11/30/22 1704
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
## 2023-01-10 ENCOUNTER — Other Ambulatory Visit (HOSPITAL_COMMUNITY): Payer: Self-pay | Admitting: Family Medicine

## 2023-01-10 DIAGNOSIS — Z1231 Encounter for screening mammogram for malignant neoplasm of breast: Secondary | ICD-10-CM

## 2023-01-10 DIAGNOSIS — M1990 Unspecified osteoarthritis, unspecified site: Secondary | ICD-10-CM | POA: Diagnosis not present

## 2023-01-10 DIAGNOSIS — E782 Mixed hyperlipidemia: Secondary | ICD-10-CM | POA: Diagnosis not present

## 2023-01-10 DIAGNOSIS — R7309 Other abnormal glucose: Secondary | ICD-10-CM | POA: Diagnosis not present

## 2023-01-10 DIAGNOSIS — F419 Anxiety disorder, unspecified: Secondary | ICD-10-CM | POA: Diagnosis not present

## 2023-01-10 DIAGNOSIS — M479 Spondylosis, unspecified: Secondary | ICD-10-CM | POA: Diagnosis not present

## 2023-01-10 DIAGNOSIS — Z0001 Encounter for general adult medical examination with abnormal findings: Secondary | ICD-10-CM | POA: Diagnosis not present

## 2023-01-10 DIAGNOSIS — Z6822 Body mass index (BMI) 22.0-22.9, adult: Secondary | ICD-10-CM | POA: Diagnosis not present

## 2023-01-10 DIAGNOSIS — Z1331 Encounter for screening for depression: Secondary | ICD-10-CM | POA: Diagnosis not present

## 2023-01-10 DIAGNOSIS — R002 Palpitations: Secondary | ICD-10-CM | POA: Diagnosis not present

## 2023-01-10 DIAGNOSIS — E559 Vitamin D deficiency, unspecified: Secondary | ICD-10-CM | POA: Diagnosis not present

## 2023-02-13 ENCOUNTER — Ambulatory Visit (HOSPITAL_COMMUNITY)
Admission: RE | Admit: 2023-02-13 | Discharge: 2023-02-13 | Disposition: A | Discharge status: Home / Self Care | Payer: 59 | Source: Ambulatory Visit | Attending: Family Medicine | Admitting: Family Medicine

## 2023-02-13 ENCOUNTER — Encounter (HOSPITAL_COMMUNITY): Payer: Self-pay

## 2023-02-13 DIAGNOSIS — Z1231 Encounter for screening mammogram for malignant neoplasm of breast: Secondary | ICD-10-CM

## 2023-03-01 ENCOUNTER — Ambulatory Visit: Payer: PRIVATE HEALTH INSURANCE | Admitting: Internal Medicine

## 2023-04-26 ENCOUNTER — Ambulatory Visit: Payer: PRIVATE HEALTH INSURANCE | Admitting: Internal Medicine

## 2023-05-08 DIAGNOSIS — D175 Benign lipomatous neoplasm of intra-abdominal organs: Secondary | ICD-10-CM | POA: Diagnosis not present

## 2023-05-08 DIAGNOSIS — K6282 Dysplasia of anus: Secondary | ICD-10-CM | POA: Diagnosis not present

## 2023-05-08 DIAGNOSIS — K648 Other hemorrhoids: Secondary | ICD-10-CM | POA: Diagnosis not present

## 2023-05-08 DIAGNOSIS — D120 Benign neoplasm of cecum: Secondary | ICD-10-CM | POA: Diagnosis not present

## 2023-05-08 DIAGNOSIS — Z1211 Encounter for screening for malignant neoplasm of colon: Secondary | ICD-10-CM | POA: Diagnosis not present

## 2023-05-08 DIAGNOSIS — Z800 Family history of malignant neoplasm of digestive organs: Secondary | ICD-10-CM | POA: Diagnosis not present

## 2023-05-08 DIAGNOSIS — K620 Anal polyp: Secondary | ICD-10-CM | POA: Diagnosis not present

## 2023-07-04 ENCOUNTER — Ambulatory Visit: Payer: Self-pay | Admitting: Internal Medicine

## 2023-07-25 DIAGNOSIS — W01190A Fall on same level from slipping, tripping and stumbling with subsequent striking against furniture, initial encounter: Secondary | ICD-10-CM | POA: Diagnosis not present

## 2023-07-25 DIAGNOSIS — S0101XA Laceration without foreign body of scalp, initial encounter: Secondary | ICD-10-CM | POA: Diagnosis not present

## 2023-11-30 DIAGNOSIS — R6889 Other general symptoms and signs: Secondary | ICD-10-CM | POA: Diagnosis not present

## 2023-11-30 DIAGNOSIS — Z6822 Body mass index (BMI) 22.0-22.9, adult: Secondary | ICD-10-CM | POA: Diagnosis not present

## 2023-11-30 DIAGNOSIS — Z20828 Contact with and (suspected) exposure to other viral communicable diseases: Secondary | ICD-10-CM | POA: Diagnosis not present

## 2023-11-30 DIAGNOSIS — U071 COVID-19: Secondary | ICD-10-CM | POA: Diagnosis not present

## 2024-04-29 ENCOUNTER — Ambulatory Visit (INDEPENDENT_AMBULATORY_CARE_PROVIDER_SITE_OTHER): Admitting: Obstetrics & Gynecology

## 2024-04-29 ENCOUNTER — Encounter: Payer: Self-pay | Admitting: Obstetrics & Gynecology

## 2024-04-29 VITALS — BP 137/86 | HR 83 | Ht 60.0 in | Wt 119.0 lb

## 2024-04-29 DIAGNOSIS — K589 Irritable bowel syndrome without diarrhea: Secondary | ICD-10-CM | POA: Diagnosis not present

## 2024-04-29 DIAGNOSIS — Z8719 Personal history of other diseases of the digestive system: Secondary | ICD-10-CM | POA: Diagnosis not present

## 2024-04-29 DIAGNOSIS — R102 Pelvic and perineal pain: Secondary | ICD-10-CM | POA: Diagnosis not present

## 2024-04-29 NOTE — Progress Notes (Signed)
 Chief Complaint  Patient presents with   abdominal pain near ovaries      63 y.o. [id number] No LMP recorded. Patient has had a hysterectomy. The current method of family planning is status post hysterectomy.  Outpatient Encounter Medications as of 04/29/2024  Medication Sig   diphenhydrAMINE (BENADRYL) 25 mg capsule Take 50 mg by mouth daily as needed. For allergies    ibuprofen  (ADVIL ) 800 MG tablet Take 1 tablet (800 mg total) by mouth 3 (three) times daily.   meloxicam (MOBIC) 15 MG tablet Take 15 mg by mouth daily.   omeprazole (PRILOSEC) 10 MG capsule Take 10 mg by mouth daily.   potassium chloride (MICRO-K) 10 MEQ CR capsule Take 10 mEq by mouth 2 (two) times daily.   tiZANidine  (ZANAFLEX ) 4 MG tablet Take 1 tablet (4 mg total) by mouth every 8 (eight) hours as needed for muscle spasms. Do not drink alcohol or drive while on this medication.  May cause drowsiness.   Cholecalciferol 50 MCG (2000 UT) TABS 1 tablet Orally Once a day; Duration: 30 day(s)   No facility-administered encounter medications on file as of 04/29/2024.    Subjective Patient presents today for intermittent episodic short episodes of pelvic pain  There is also some interesting communications from her gynecologist from 30 years ago  She had a hysterectomy because she bled for 2 years straight and had fibroids but was told her fibroids could come back She was also told that he shaved her ovaries and that was about all she said She has been menopausal she thinks based on symptoms about 15 years  Her pain if you are last a short period of time and comes out of nowhere She has a history of IBS D She does not have constipation issues Past Medical History:  Diagnosis Date   Anemia    Arthritis    GERD (gastroesophageal reflux disease)    Head ache    IBS (irritable bowel syndrome)    Panic attacks     Past Surgical History:  Procedure Laterality Date   ABDOMINAL HYSTERECTOMY      OB History      Gravida  4   Para      Term      Preterm      AB  2   Living  2      SAB  2   IAB      Ectopic      Multiple      Live Births              Allergies  Allergen Reactions   Bee Venom Itching and Swelling   Latex Itching    Social History   Socioeconomic History   Marital status: Widowed    Spouse name: Not on file   Number of children: Not on file   Years of education: Not on file   Highest education level: Not on file  Occupational History   Not on file  Tobacco Use   Smoking status: Some Days    Current packs/day: 0.25    Types: Cigarettes   Smokeless tobacco: Not on file  Vaping Use   Vaping status: Never Used  Substance and Sexual Activity   Alcohol use: Yes    Comment: daily   Drug use: No   Sexual activity: Not Currently    Birth control/protection: Other-see comments    Comment: hyst  Other Topics Concern   Not on file  Social History Narrative   Not on file   Social Drivers of Health   Financial Resource Strain: Medium Risk (04/29/2024)   Overall Financial Resource Strain (CARDIA)    Difficulty of Paying Living Expenses: Somewhat hard  Food Insecurity: Unknown (04/29/2024)   Hunger Vital Sign    Worried About Running Out of Food in the Last Year: Patient declined    Ran Out of Food in the Last Year: Never true  Transportation Needs: No Transportation Needs (04/29/2024)   PRAPARE - Administrator, Civil Service (Medical): No    Lack of Transportation (Non-Medical): No  Physical Activity: Insufficiently Active (04/29/2024)   Exercise Vital Sign    Days of Exercise per Week: 1 day    Minutes of Exercise per Session: 10 min  Stress: Stress Concern Present (04/29/2024)   [employer] of Occupational Health - Occupational Stress Questionnaire    Feeling of Stress: Very much  Social Connections: Socially Isolated (04/29/2024)   Social Connection and Isolation Panel    Frequency of Communication with Friends and  Family: Once a week    Frequency of Social Gatherings with Friends and Family: Never    Attends Religious Services: Never    Database administrator or Organizations: No    Attends Banker Meetings: Never    Marital Status: Widowed    Family History  Adopted: Yes  Problem Relation Age of Onset   Heart Problems Father    Diabetes Mother     Medications:       Current Outpatient Medications:    diphenhydrAMINE (BENADRYL) 25 mg capsule, Take 50 mg by mouth daily as needed. For allergies , Disp: , Rfl:    ibuprofen  (ADVIL ) 800 MG tablet, Take 1 tablet (800 mg total) by mouth 3 (three) times daily., Disp: 30 tablet, Rfl: 0   meloxicam (MOBIC) 15 MG tablet, Take 15 mg by mouth daily., Disp: , Rfl:    omeprazole (PRILOSEC) 10 MG capsule, Take 10 mg by mouth daily., Disp: , Rfl:    potassium chloride (MICRO-K) 10 MEQ CR capsule, Take 10 mEq by mouth 2 (two) times daily., Disp: , Rfl:    tiZANidine  (ZANAFLEX ) 4 MG tablet, Take 1 tablet (4 mg total) by mouth every 8 (eight) hours as needed for muscle spasms. Do not drink alcohol or drive while on this medication.  May cause drowsiness., Disp: 15 tablet, Rfl: 0   Cholecalciferol 50 MCG (2000 UT) TABS, 1 tablet Orally Once a day; Duration: 30 day(s), Disp: , Rfl:   Objective Blood pressure 137/86, pulse 83, height 5' (1.524 m), weight 119 lb (54 kg).  General WDWN female NAD Abdomen nontender benign Vulva:  normal appearing vulva with no masses, tenderness or lesions Vagina:  normal mucosa, no discharge Cervix: Surgically absent Uterus: Surgically absent Adnexa: No masses palpable midline or adnexa  Pertinent ROS No burning with urination, frequency or urgency No nausea, vomiting or diarrhea Nor fever chills or other constitutional symptoms   Labs or studies Non contributory    Impression + Management Plan: Diagnoses this Encounter::   ICD-10-CM   1. Spasm of bowel  K58.9 US  PELVIC COMPLETE WITH TRANSVAGINAL    US   PELVIC COMPLETE WITH TRANSVAGINAL    2. History of IBS  Z87.19 US  PELVIC COMPLETE WITH TRANSVAGINAL    US  PELVIC COMPLETE WITH TRANSVAGINAL    3. Pelvic pain  R10.2 US  PELVIC COMPLETE WITH TRANSVAGINAL    US  PELVIC COMPLETE WITH TRANSVAGINAL  Medications prescribed during  this encounter: No orders of the defined types were placed in this encounter.   Labs or Scans Ordered during this encounter: Orders Placed This Encounter  Procedures   US  PELVIC COMPLETE WITH TRANSVAGINAL   Give the patient a call when her pelvic ultrasound returns I think is going to be negative from a GYN standpoint Probably related to her irritable bowel  We talked extensively about body composition diet and resistance exercise   Follow up Return if symptoms worsen or fail to improve.

## 2024-05-29 ENCOUNTER — Ambulatory Visit (HOSPITAL_COMMUNITY): Admission: RE | Admit: 2024-05-29 | Source: Ambulatory Visit

## 2024-09-24 ENCOUNTER — Ambulatory Visit
Admission: EM | Admit: 2024-09-24 | Discharge: 2024-09-24 | Disposition: A | Discharge status: Home / Self Care | Attending: Family Medicine | Admitting: Family Medicine

## 2024-09-24 DIAGNOSIS — W19XXXA Unspecified fall, initial encounter: Secondary | ICD-10-CM

## 2024-09-24 DIAGNOSIS — S5011XA Contusion of right forearm, initial encounter: Secondary | ICD-10-CM

## 2024-09-24 NOTE — ED Triage Notes (Signed)
 Pt reports she fell off of a kitchen step stool and now has a raised lump on her right forearm today States she has numbness from the lump on her forearm to her right fingers.   States she hit her head

## 2024-09-24 NOTE — ED Provider Notes (Signed)
 [facility]    CSN: [id number] Arrival date & time: 09/24/24  1248      History   Chief Complaint No chief complaint on file.   HPI Christy Irwin is a 63 y.o. female.   Patient presenting today with right forearm pain, bruising, swelling after falling off of a stepstool in her kitchen while cleaning the cabinets earlier today.  She states she did hit the back of her head but not very hard and did not lose consciousness during the incident.  Her main complaint is a large bruised swollen area to the right forearm though she states it has come down in size and pain even since she has been here.  Denies loss of range of motion, numbness, tingling, weakness, other areas of pain after the fall, blurred vision, headache, mental status changes, nausea, vomiting.  So far not try anything over-the-counter for symptoms.    Past Medical History:  Diagnosis Date   Anemia    Arthritis    GERD (gastroesophageal reflux disease)    Head ache    IBS (irritable bowel syndrome)    Panic attacks     There are no active problems to display for this patient.   Past Surgical History:  Procedure Laterality Date   ABDOMINAL HYSTERECTOMY      OB History     Gravida  4   Para      Term      Preterm      AB  2   Living  2      SAB  2   IAB      Ectopic      Multiple      Live Births               Home Medications    Prior to Admission medications   Medication Sig Start Date End Date Taking? Authorizing Provider  Cholecalciferol 50 MCG (2000 UT) TABS 1 tablet Orally Once a day; Duration: 30 day(s)    [provider]  diphenhydrAMINE (BENADRYL) 25 mg capsule Take 50 mg by mouth daily as needed. For allergies     [provider]  ibuprofen  (ADVIL ) 800 MG tablet Take 1 tablet (800 mg total) by mouth 3 (three) times daily. 03/21/22   Leath-Warren, Etta PARAS, NP  meloxicam (MOBIC) 15 MG tablet Take 15 mg by mouth daily. 04/08/24   [provider]  omeprazole (PRILOSEC) 10 MG capsule Take 10 mg by mouth daily.    [provider]  potassium chloride (MICRO-K) 10 MEQ CR capsule Take 10 mEq by mouth 2 (two) times daily.    [provider]  tiZANidine  (ZANAFLEX ) 4 MG tablet Take 1 tablet (4 mg total) by mouth every 8 (eight) hours as needed for muscle spasms. Do not drink alcohol or drive while on this medication.  May cause drowsiness. 11/30/22   Stuart Vernell Norris, PA-C    Family History Family History  Adopted: Yes  Problem Relation Age of Onset   Heart Problems Father    Diabetes Mother     Social History Social History   Tobacco Use   Smoking status: Some Days    Current packs/day: 0.25    Types: Cigarettes  Vaping Use   Vaping status: Never Used  Substance Use Topics   Alcohol use: Yes    Comment: daily   Drug use: No     Allergies   Bee venom and Latex   Review of Systems  Review of Systems Per HPI  Physical Exam Triage Vital Signs ED Triage Vitals  Encounter Vitals Group     BP 09/24/24 1313 135/84     Girls Systolic BP Percentile --      Girls Diastolic BP Percentile --      Boys Systolic BP Percentile --      Boys Diastolic BP Percentile --      Pulse Rate 09/24/24 1313 90     Resp 09/24/24 1313 16     Temp 09/24/24 1313 97.8 F (36.6 C)     Temp Source 09/24/24 1313 Oral     SpO2 09/24/24 1313 93 %     Weight --      Height --      Head Circumference --      Peak Flow --      Pain Score 09/24/24 1315 0     Pain Loc --      Pain Education --      Exclude from Growth Chart --    No data found.  Updated Vital Signs BP 135/84   Pulse 90   Temp 97.8 F (36.6 C) (Oral)   Resp 16   SpO2 93%   Visual Acuity Right Eye Distance:   Left Eye Distance:   Bilateral Distance:    Right Eye Near:   Left Eye Near:    Bilateral Near:     Physical Exam Vitals and nursing note reviewed.  Constitutional:      Appearance: Normal appearance. She is not  ill-appearing.  HENT:     Head: Atraumatic.  Eyes:     Extraocular Movements: Extraocular movements intact.     Conjunctiva/sclera: Conjunctivae normal.  Cardiovascular:     Rate and Rhythm: Normal rate.  Pulmonary:     Effort: Pulmonary effort is normal.  Musculoskeletal:        General: Normal range of motion.     Cervical back: Normal range of motion and neck supple.     Comments: Right upper extremity range of motion intact, no bony deformity palpable.  Tender only over the hematoma to palpation  Skin:    General: Skin is warm and dry.     Findings: Bruising present.     Comments: 2 cm hematoma present to the right distal forearm with pinpoint abrasion centrally  Neurological:     Mental Status: She is alert and oriented to person, place, and time. Mental status is at baseline.     Comments: Right upper extremity neurovascularly intact  Psychiatric:        Mood and Affect: Mood normal.        Thought Content: Thought content normal.        Judgment: Judgment normal.      UC Treatments / Results  Labs (all labs ordered are listed, but only abnormal results are displayed) Labs Reviewed - No data to display  EKG   Radiology No results found.  Procedures Procedures (including critical care time)  Medications Ordered in UC Medications - No data to display  Initial Impression / Assessment and Plan / UC Course  I have reviewed the triage vital signs and the nursing notes.  Pertinent labs & imaging results that were available during my care of the patient were reviewed by me and considered in my medical decision making (see chart for details).     Exam very reassuring today, no focal neurologic deficits and very low suspicion for bony injury of the right upper  extremity.  X-ray imaging deferred today with shared decision making.  Suspect arm contusion.  Treat with RICE, over-the-counter pain relievers.  Follow-up for worsening or unresolving symptoms.  Final  Clinical Impressions(s) / UC Diagnoses   Final diagnoses:  Contusion of right forearm, initial encounter  Fall, initial encounter     Discharge Instructions      Rest, ice, compression, elevation, over-the-counter pain relievers as needed.  Follow-up for worsening or unresolving symptoms.    ED Prescriptions   None    PDMP not reviewed this encounter.   Stuart Vernell Norris, [place] 09/24/24 1354

## 2024-09-24 NOTE — Discharge Instructions (Signed)
 Rest, ice, compression, elevation, over-the-counter pain relievers as needed.  Follow-up for worsening or unresolving symptoms.

## 2024-12-04 ENCOUNTER — Ambulatory Visit: Payer: Self-pay | Admitting: Family Medicine

## 2025-04-15 ENCOUNTER — Ambulatory Visit: Payer: Self-pay
